# Patient Record
Sex: Female | Born: 1935 | Race: White | Hispanic: No | Smoking: Former smoker
Health system: Southern US, Academic
[De-identification: ages and names within clinical notes are randomized; demographics above are authoritative.]

## PROBLEM LIST (undated history)

## (undated) DIAGNOSIS — I2510 Atherosclerotic heart disease of native coronary artery without angina pectoris: Secondary | ICD-10-CM

## (undated) DIAGNOSIS — M51369 Other intervertebral disc degeneration, lumbar region without mention of lumbar back pain or lower extremity pain: Secondary | ICD-10-CM

## (undated) DIAGNOSIS — L821 Other seborrheic keratosis: Secondary | ICD-10-CM

## (undated) DIAGNOSIS — M48061 Spinal stenosis, lumbar region without neurogenic claudication: Secondary | ICD-10-CM

## (undated) DIAGNOSIS — M5136 Other intervertebral disc degeneration, lumbar region: Secondary | ICD-10-CM

## (undated) DIAGNOSIS — G43909 Migraine, unspecified, not intractable, without status migrainosus: Secondary | ICD-10-CM

## (undated) HISTORY — DX: Other seborrheic keratosis: L82.1

## (undated) HISTORY — PX: MOUTH SURGERY: SHX715

## (undated) HISTORY — PX: HX TONSILLECTOMY: SHX27

## (undated) HISTORY — PX: HX HYSTERECTOMY: SHX81

## (undated) HISTORY — DX: Atherosclerotic heart disease of native coronary artery without angina pectoris: I25.10

## (undated) HISTORY — DX: Spinal stenosis, lumbar region without neurogenic claudication: M48.061

## (undated) HISTORY — DX: Other intervertebral disc degeneration, lumbar region without mention of lumbar back pain or lower extremity pain: M51.369

## (undated) HISTORY — PX: CARDIAC CATHETERIZATION: SHX172

## (undated) HISTORY — DX: Migraine, unspecified, not intractable, without status migrainosus: G43.909

## (undated) HISTORY — PX: HX WISDOM TEETH EXTRACTION: SHX21

## (undated) HISTORY — DX: Other intervertebral disc degeneration, lumbar region: M51.36

---

## 1973-05-13 HISTORY — PX: HX HYSTERECTOMY: SHX81

## 2005-05-13 HISTORY — PX: CARDIAC CATHETERIZATION: SHX172

## 2020-09-29 ENCOUNTER — Ambulatory Visit (HOSPITAL_COMMUNITY): Payer: Self-pay | Admitting: Family Medicine

## 2021-11-19 ENCOUNTER — Encounter (RURAL_HEALTH_CENTER): Payer: Self-pay | Admitting: Family Medicine

## 2021-11-19 NOTE — Progress Notes (Signed)
Chronic Disease Management-AMB  INTERVAL HISTORY    88 year old female here today for follow up  no acute complaints or concerns  doing well today    CHRONIC CONDITIONS    Multiple seborrheic keratoses:  These are scattered diffusely over her entire body, most notably across her back, upper extremities and trunk.  Several of these are large and easily get caught on her clothing.  Now established with dermatology    Degenerative disc disease of lumbar spine:  MRI from [facility] 04/25/2017:  Moderate to severe narrowing of the L5-S1 disc space  Mild to moderate bilateral facet joint hypertrophy at L4-5 and L5-S1  Mild concentric foraminal stenosis at L5-S1  Referral to physical therapy May 2022  Completed PT which helped and still does home exercises    Coronary artery disease:  Had a stent placed years ago.  I was never on dual antiplatelet therapy per her report.  Previously on aspirin 325 mg daily.  She had GI side effects from this and discontinued this sometime ago.  Discussed risks and benefits, restarted aspirin 81 mg daily May 2022    PREVENTATIVE  Bone density ordered May 2022  Counseled on mammography May 2022, patient declined given her advanced age, no family history of breast cancer.  Colonoscopy was "years ago", no family history of colon cancer, declines further screening at this time.                     Assessment & Plan  (1) Coronary artery disease involving native coronary artery of native heart:    Status:Acute    Code(s):  I25.10 - Atherosclerotic heart disease of native coronary artery without angina pectoris    Qualifiers:     Associated angina:without angina Qualified Code(s):I25.10 - Atherosclerotic heart disease of native coronary artery without angina pectoris  (2) Seborrheic keratoses:    Status:Acute    Code(s):  L82.1 - Other seborrheic keratosis  (3) DDD (degenerative disc disease), lumbar:    Status:Acute    Code(s):  M51.36 - Other  intervertebral disc degeneration, lumbar region    Plan  Bone density was ordered last may but never got performed, reordered today.  Will recheck labs at six month follow up  She is doing well otherwise today  follow up sooner if needed.  Counseled on vaccines today, declined.     Orders:  Orders   XR bone densitometry Today Z13.820 - Encounter for screening for osteoporosis, Z78.0 - Asymptomatic menopausal state

## 2021-11-27 ENCOUNTER — Ambulatory Visit (RURAL_HEALTH_CENTER): Payer: Self-pay | Admitting: Family Medicine

## 2022-04-09 ENCOUNTER — Other Ambulatory Visit: Payer: Self-pay

## 2022-04-09 ENCOUNTER — Ambulatory Visit (INDEPENDENT_AMBULATORY_CARE_PROVIDER_SITE_OTHER): Payer: Self-pay | Admitting: NURSE PRACTITIONER

## 2022-04-09 ENCOUNTER — Encounter (INDEPENDENT_AMBULATORY_CARE_PROVIDER_SITE_OTHER): Payer: Self-pay | Admitting: NURSE PRACTITIONER

## 2022-04-09 ENCOUNTER — Ambulatory Visit (INDEPENDENT_AMBULATORY_CARE_PROVIDER_SITE_OTHER): Payer: Medicare Other | Admitting: NURSE PRACTITIONER

## 2022-04-09 VITALS — Ht 62.0 in | Wt 145.0 lb

## 2022-04-09 DIAGNOSIS — H6123 Impacted cerumen, bilateral: Secondary | ICD-10-CM

## 2022-04-09 NOTE — Progress Notes (Signed)
ENT, [facility]  [address]  [place] [address]  Phone: [phone]  Fax: [phone]      Encounter Date: 04/09/2022    Patient ID: Judith Walton  MRN: [id number]    DOB: December 24, 1933  Age: 88 y.o. female         Referring Provider:    No referring provider defined for this encounter.    Reason for Visit:   Chief Complaint   Patient presents with    Ear Problem(s)     Patient complains of bil cerumen impaction. Patient states she has a hx of a tm perf L ear        History of Present Illness:  Judith Walton is a 88 y.o. female referred for cerumen impaction.  She has history of chronic left TM perforation      Patient History:  There is no problem list on file for this patient.    No current outpatient medications on file.     No Known Allergies  Past Medical History:   Diagnosis Date    Coronary artery disease involving native coronary artery of native heart     DDD (degenerative disc disease), lumbar     Foraminal stenosis of lumbar region     Migraine     Seborrheic keratoses       Past Surgical History:   Procedure Laterality Date    CARDIAC CATHETERIZATION      HX HYSTERECTOMY      HX TONSILLECTOMY      MOUTH SURGERY      Dr Phylliss Bob oral surgery-sept 2022,cyst removal      Family Medical History:       Problem Relation (Age of Onset)    Cancer Other    Heart Disease Other    Hypertension (High Blood Pressure) Other    Migraines Other    No Known Problems Father, Brother, Brother, Brother            Social History     Tobacco Use    Smoking status: Never    Smokeless tobacco: Never   Vaping Use    Vaping Use: Unknown   Substance Use Topics    Alcohol use: Never    Drug use: Never       Review of Systems     Vitals:    04/09/22 1626   Weight: 65.8 kg (145 lb)   Height: 1.575 m (5\' 2" )   BMI: 26.58      ENT Physical Exam  Constitutional  Appearance: patient appears well-developed, well-nourished and well-groomed,  Communication/Voice: communication appropriate for developmental age;  vocal quality normal;  Head and Face  Appearance: head appears normal, face appears normal and face appears atraumatic;  Ear  Hearing: intact;  Auricles: right auricle normal; left auricle normal;  External Mastoids: right external mastoid normal; left external mastoid normal;  Ear Canals: bilateral ear canals impacted cerumen observed;  Tympanic Membranes: right tympanic membrane normal; left tympanic membrane normal;  Neurovestibular  Mental Status: alert and oriented;  Psychiatric: mood normal; affect is appropriate;       Assessment:  ENCOUNTER DIAGNOSES     ICD-10-CM   1. Bilateral impacted cerumen  H61.23       Plan:  Medical records reviewed on 04/09/2022.  Cerumen removed AU    Orders Placed This Encounter    [phone] - REMOVAL IMPACTED CERUMEN W/ INSTRUMENT, UNILATERAL (AMB ONLY-PD)     Return in about 1 year (around 04/10/2023).  Emiliano Dyer, FNP-BC  04/09/2022, 16:48

## 2022-04-10 ENCOUNTER — Ambulatory Visit (INDEPENDENT_AMBULATORY_CARE_PROVIDER_SITE_OTHER): Payer: Medicare Other | Admitting: NURSE PRACTITIONER

## 2022-04-17 NOTE — Procedures (Signed)
ENT, [facility]  [place] [address]    Procedure Note    Name: Judith Walton MRN:  [id number]   Date: 04/09/2022 Age: 88 y.o.  DOB:   [birth year]       [id number] - REMOVAL IMPACTED CERUMEN W/ INSTRUMENT, UNILATERAL (AMB ONLY-PD)    Performed by: Emiliano Dyer, FNP-BC  Authorized by: Emiliano Dyer, FNP-BC    Documentation:      Procedure: Cerumen cleaning   Pre-op Dx: Cerumen impaction   Bilateral EACs examined under binocular microscopy. Cerumen and/or debris was cleaned from the canals using curettes, suction, and alligator forceps. Patient tolerated procedure well.            Emiliano Dyer, FNP-BC

## 2022-10-19 ENCOUNTER — Other Ambulatory Visit: Payer: Self-pay

## 2022-10-19 ENCOUNTER — Emergency Department
Admission: EM | Admit: 2022-10-19 | Discharge: 2022-10-19 | Disposition: A | Discharge status: Home / Self Care | Payer: Medicare Other | Attending: Physician Assistant | Admitting: Physician Assistant

## 2022-10-19 ENCOUNTER — Encounter (HOSPITAL_COMMUNITY): Payer: Self-pay | Admitting: Physician Assistant

## 2022-10-19 ENCOUNTER — Emergency Department (HOSPITAL_COMMUNITY): Payer: Medicare Other

## 2022-10-19 DIAGNOSIS — S52692A Other fracture of lower end of left ulna, initial encounter for closed fracture: Secondary | ICD-10-CM | POA: Insufficient documentation

## 2022-10-19 DIAGNOSIS — W010XXA Fall on same level from slipping, tripping and stumbling without subsequent striking against object, initial encounter: Secondary | ICD-10-CM | POA: Insufficient documentation

## 2022-10-19 DIAGNOSIS — S52592A Other fractures of lower end of left radius, initial encounter for closed fracture: Secondary | ICD-10-CM | POA: Insufficient documentation

## 2022-10-19 DIAGNOSIS — S52509A Unspecified fracture of the lower end of unspecified radius, initial encounter for closed fracture: Secondary | ICD-10-CM

## 2022-10-19 DIAGNOSIS — S0081XA Abrasion of other part of head, initial encounter: Secondary | ICD-10-CM | POA: Insufficient documentation

## 2022-10-19 DIAGNOSIS — W0110XA Fall on same level from slipping, tripping and stumbling with subsequent striking against unspecified object, initial encounter: Secondary | ICD-10-CM

## 2022-10-19 MED ORDER — HYDROCODONE 5 MG-ACETAMINOPHEN 325 MG TABLET
1.0000 | ORAL_TABLET | Freq: Four times a day (QID) | ORAL | 0 refills | Status: DC | PRN
Start: 2022-10-19 — End: 2023-03-10

## 2022-10-19 MED ORDER — HYDROCODONE 5 MG-ACETAMINOPHEN 325 MG TABLET
ORAL_TABLET | ORAL | Status: AC
Start: 2022-10-19 — End: 2022-10-19
  Filled 2022-10-19: qty 1

## 2022-10-19 MED ORDER — HYDROCODONE 5 MG-ACETAMINOPHEN 325 MG TABLET
2.0000 | ORAL_TABLET | ORAL | Status: AC
Start: 2022-10-19 — End: 2022-10-19
  Administered 2022-10-19: 2 via ORAL

## 2022-10-19 NOTE — ED Triage Notes (Signed)
Fall left wrist injury approx 45 mins ago

## 2022-10-19 NOTE — Discharge Instructions (Signed)
Keep the splint intact.  A prescription for pain medication was written take this as needed.  Follow up with Dr. Nicholes Stairs (orthopedic doctor) contact information provided in this packet.  If new or worsening symptoms prior to follow-up return to the emergency department

## 2022-10-19 NOTE — ED Provider Notes (Signed)
[facility]  ED Primary Provider Note  History of Present Illness   Chief Complaint   Patient presents with    Judith Walton is a 89 y.o. female who had concerns including Fall.  Arrival: The patient arrived by Car    Patient is an 89 year old female past will history of coronary artery disease degenerative disc disease of the lumbar spine presents emergency department with complaints of fall and injury to the left wrist.  States she was chasing her dog tripped over a rock.  States she did hit her head she was not anticoagulated she did not lose consciousness denies headache or neck pain.  Denies nausea vomiting.      History Reviewed This Encounter: Medical History  Surgical History  Family History  Social History    Physical Exam   ED Triage Vitals [10/19/22 1829]   BP (Non-Invasive) (!) 169/79   Heart Rate 99   Respiratory Rate 20   Temperature 36.8 C (98.2 F)   SpO2 98 %   Weight 65.8 kg (145 lb)   Height 1.585 m (5' 2.4")     Physical Exam  Constitutional:       Appearance: Normal appearance.   HENT:      Head: Normocephalic.      Comments: Small superficial abrasion over the left forehead     Nose: Nose normal.      Mouth/Throat:      Mouth: Mucous membranes are moist.   Eyes:      Extraocular Movements: Extraocular movements intact.      Pupils: Pupils are equal, round, and reactive to light.   Cardiovascular:      Rate and Rhythm: Normal rate and regular rhythm.      Pulses: Normal pulses.      Heart sounds: Normal heart sounds.   Pulmonary:      Effort: Pulmonary effort is normal.      Breath sounds: Normal breath sounds.   Abdominal:      General: Abdomen is flat.      Palpations: Abdomen is soft.   Musculoskeletal:      Cervical back: Normal range of motion and neck supple.      Comments: Swelling of the distal radius with tenderness to palpation neurovascularly intact distally mild deformity   Neurological:      Mental Status: She is alert.       Patient  Data   Labs Ordered/Reviewed - No data to display  XR WRIST LEFT   Final Result by Edi, Radresults In (06/08 1910)   ACUTE FRACTURES OF THE DISTAL LEFT RADIUS AND ULNA                Radiologist location ID: [id number]           Medical Decision Making        Medical Decision Making  Plain films of the left wrist demonstrate mildly displaced angulated distal radius fracture with comminuted ulnar styloid fracture.  Reviewed films and discussed case with Dr. Jennette Bill recommended splinting and follow up in office.  Patient given a prescription for Lortab.  If new or worsening symptoms return to the emergency depart    Amount and/or Complexity of Data Reviewed  Radiology: ordered.                   Clinical Impression   Distal radius fracture (Primary)       Disposition: Discharged

## 2022-10-19 NOTE — ED Nurses Note (Signed)
Patient discharged at this time with all discharge instructions explained and questions answered. Patient given written prescriptions and out of ER at this time via ambulation.

## 2023-02-25 ENCOUNTER — Ambulatory Visit (INDEPENDENT_AMBULATORY_CARE_PROVIDER_SITE_OTHER): Payer: Self-pay

## 2023-03-10 ENCOUNTER — Ambulatory Visit (INDEPENDENT_AMBULATORY_CARE_PROVIDER_SITE_OTHER): Payer: Medicare Other

## 2023-03-10 ENCOUNTER — Other Ambulatory Visit: Payer: Self-pay

## 2023-03-10 ENCOUNTER — Encounter (INDEPENDENT_AMBULATORY_CARE_PROVIDER_SITE_OTHER): Payer: Self-pay

## 2023-03-10 ENCOUNTER — Other Ambulatory Visit: Payer: Medicare Other

## 2023-03-10 VITALS — BP 135/78 | HR 93 | Temp 98.4°F | Resp 16 | Ht 62.0 in | Wt 154.2 lb

## 2023-03-10 DIAGNOSIS — M25532 Pain in left wrist: Secondary | ICD-10-CM

## 2023-03-10 DIAGNOSIS — R5383 Other fatigue: Secondary | ICD-10-CM

## 2023-03-10 DIAGNOSIS — G8929 Other chronic pain: Secondary | ICD-10-CM

## 2023-03-10 DIAGNOSIS — G43909 Migraine, unspecified, not intractable, without status migrainosus: Secondary | ICD-10-CM

## 2023-03-10 DIAGNOSIS — M51369 Other intervertebral disc degeneration, lumbar region without mention of lumbar back pain or lower extremity pain: Secondary | ICD-10-CM

## 2023-03-10 DIAGNOSIS — Z136 Encounter for screening for cardiovascular disorders: Secondary | ICD-10-CM

## 2023-03-10 DIAGNOSIS — I2510 Atherosclerotic heart disease of native coronary artery without angina pectoris: Secondary | ICD-10-CM

## 2023-03-10 DIAGNOSIS — L821 Other seborrheic keratosis: Secondary | ICD-10-CM

## 2023-03-10 DIAGNOSIS — Z0000 Encounter for general adult medical examination without abnormal findings: Secondary | ICD-10-CM | POA: Insufficient documentation

## 2023-03-10 LAB — HEPATIC FUNCTION PANEL
ALBUMIN/GLOBULIN RATIO: 1.4 (ref 0.8–1.4)
ALBUMIN: 4.2 g/dL (ref 3.5–5.7)
ALKALINE PHOSPHATASE: 56 U/L (ref 34–104)
ALT (SGPT): 14 U/L (ref 7–52)
AST (SGOT): 14 U/L (ref 13–39)
BILIRUBIN DIRECT: <0.1 md/dL (ref 0.03–0.18)
BILIRUBIN TOTAL: 0.4 mg/dL (ref 0.3–1.0)
BILIRUBIN, INDIRECT: >0.3 mg/dL (ref <=1)
GLOBULIN: 3 (ref 2.9–5.4)
PROTEIN TOTAL: 7.2 g/dL (ref 6.4–8.9)

## 2023-03-10 LAB — CBC WITH DIFF
BASOPHIL #: 0.1 10*3/uL (ref 0.00–0.10)
BASOPHIL %: 1 % (ref 0–1)
EOSINOPHIL #: 0.1 10*3/uL (ref 0.00–0.50)
EOSINOPHIL %: 2 % (ref 1–7)
HCT: 40 % (ref 31.2–41.9)
HGB: 13.4 g/dL (ref 10.9–14.3)
LYMPHOCYTE #: 2.1 10*3/uL (ref 1.00–3.00)
LYMPHOCYTE %: 25 % (ref 16–44)
MCH: 29.7 pg (ref 24.7–32.8)
MCHC: 33.6 g/dL (ref 32.3–35.6)
MCV: 88.6 fL (ref 75.5–95.3)
MONOCYTE #: 0.5 10*3/uL (ref 0.30–1.00)
MONOCYTE %: 6 % (ref 5–13)
MPV: 10.5 fL (ref 7.9–10.8)
NEUTROPHIL #: 5.4 10*3/uL (ref 1.85–7.80)
NEUTROPHIL %: 66 % (ref 43–77)
PLATELETS: 344 10*3/uL (ref 140–440)
RBC: 4.51 10*6/uL (ref 3.63–4.92)
RDW: 14 % (ref 12.3–17.7)
WBC: 8.2 10*3/uL (ref 3.8–11.8)

## 2023-03-10 LAB — BASIC METABOLIC PANEL
ANION GAP: 6 mmol/L (ref 4–13)
BUN/CREA RATIO: 22 (ref 6–22)
BUN: 17 mg/dL (ref 7–25)
CALCIUM: 9.5 mg/dL (ref 8.6–10.3)
CHLORIDE: 104 mmol/L (ref 98–107)
CO2 TOTAL: 28 mmol/L (ref 21–31)
CREATININE: 0.76 mg/dL (ref 0.60–1.30)
ESTIMATED GFR: 76 mL/min/{1.73_m2} (ref >59)
GLUCOSE: 84 mg/dL (ref 74–109)
OSMOLALITY, CALCULATED: 277 mosm/kg (ref 270–290)
POTASSIUM: 4.2 mmol/L (ref 3.5–5.1)
SODIUM: 138 mmol/L (ref 136–145)

## 2023-03-10 LAB — LIPID PANEL
CHOL/HDL RATIO: 3.7
CHOLESTEROL: 258 mg/dL — ABNORMAL HIGH (ref <200)
HDL CHOL: 69 mg/dL (ref >=40)
LDL CALC: 166 mg/dL — ABNORMAL HIGH (ref 0–100)
TRIGLYCERIDES: 115 mg/dL (ref <=150)
VLDL CALC: 23 mg/dL (ref 0–50)

## 2023-03-10 LAB — THYROID STIMULATING HORMONE WITH FREE T4 REFLEX: TSH: 1.254 u[IU]/mL (ref 0.450–5.330)

## 2023-03-10 NOTE — Assessment & Plan Note (Signed)
Patient does not have imaging of this and denies any ROM limitation or back pain associated with the diagnosis.

## 2023-03-10 NOTE — Nursing Note (Signed)
03/10/23 1017   Fall Risk Assessment   Do you feel unsteady when standing or walking? No   Do you worry about falling? Yes   Have you fallen in the past year? Yes   How many times have you fallen? Once   Were you ever injured from falling? Yes

## 2023-03-10 NOTE — Progress Notes (Signed)
[facility]  FAMILY MEDICINE, MEDICAL OFFICE BUILDING  [address]  [place] [address]  [phone]   Judith Walton  1934/04/29  [id number]    Date of Service: 03/10/2023  Chief complaint:   Chief Complaint   Patient presents with    New Patient     Subjective:     Judith Walton is a 89 y.o. female and presents today as a new patient to establish care, baseline lab values and medication refills. Patient denies being established with a PCP since moving to the area from [place]. She endorses ongoing management with ENT for periodic scraping of cerumen impactions. She visits her Optometrist every 2 years for glasses. Patient was recently associated with Orthopedic surgery s/p fracture of distal left radius and ulna. She verbalizes her left wrist pain as her only acute complaint. Patient states that she has limited ROM and constant pain and weakness 4 months after the break.    Past Medical History:   Diagnosis Date    Coronary artery disease involving native coronary artery of native heart     S/P MI and stent placement    DDD (degenerative disc disease), lumbar     Foraminal stenosis of lumbar region     Migraine     Seborrheic keratoses      Past Surgical History:   Procedure Laterality Date    CARDIAC CATHETERIZATION  2007    Stent placement    HX HYSTERECTOMY  1975    HX TONSILLECTOMY      HX WISDOM TEETH EXTRACTION      MOUTH SURGERY      Dr Phylliss Bob oral surgery-sept 2022,cyst removal     No current outpatient medications on file.     No current facility-administered medications for this visit.     No Known Allergies    Review of Systems  Any pertinent Review of Systems as addressed in the HPI above.    Objective:     BP 135/78   Pulse 93   Temp 36.9 C (98.4 F) (Temporal)   Resp 16   Ht 1.575 m (5\' 2" )   Wt 69.9 kg (154 lb 3.2 oz)   SpO2 96%   BMI 28.20 kg/m      Physical Exam  Vitals reviewed.   Constitutional:       General: She is not in acute  distress.     Appearance: Normal appearance.   Cardiovascular:      Rate and Rhythm: Normal rate and regular rhythm.      Pulses: Normal pulses.      Heart sounds: Normal heart sounds.   Pulmonary:      Effort: Pulmonary effort is normal.      Breath sounds: Normal breath sounds.   Abdominal:      General: Bowel sounds are normal.      Palpations: Abdomen is soft.   Musculoskeletal:      Left wrist: Bony tenderness present. Decreased range of motion.   Skin:     General: Skin is warm and dry.      Capillary Refill: Capillary refill takes less than 2 seconds.   Neurological:      General: No focal deficit present.      Mental Status: She is alert and oriented to person, place, and time. Mental status is at baseline.   Psychiatric:         Attention and Perception: Attention normal.  Mood and Affect: Mood and affect normal.         Speech: Speech normal.         Behavior: Behavior normal. Behavior is cooperative.         Thought Content: Thought content normal.       Laboratory:  No recent lab results available to review with the patient.  Assessment & Plan  DDD (degenerative disc disease), lumbar  Patient does not have imaging of this and denies any ROM limitation or back pain associated with the diagnosis.  Migraine  She states that she had constant migraines in her 40's and 50's but denies having one in many years.   Coronary artery disease involving native coronary artery of native heart  Patient verbalizes this was revealed s/p MI and stent placement in 2007, but denies any symptoms or treatment.  Seborrheic keratoses  Patient endorses periodic dermatology appointments whenever she wants to have some of these removed.   Fatigue, unspecified type  Lab work ordered to determine cause.  Chronic wrist pain, left  OTC NSAID'S, ineffective. Left wrist Xray ordered.    I reviewed the patient's past medical, surgical, social and family histories. Immunizations and allergies were reviewed and discussed. Counseling  with regards to preventative care given. Medication summary was discussed with the patient to verify compliance and understanding. Medication safety was discussed. A good faith effort was made to reconcile the patient's medications. Lab orders were placed for BMP, CBC, Hepatic function panel, Lipid panel and TSH to establish a baseline in a new patient. The patient was given the opportunity to ask questions and those questions were answered to the patient's satisfaction. The patient was encouraged to call with any additional questions or concerns. Instructed patient to call back with onset of new symptoms or if current symptoms persist or worsen. More than 50% of the visit was spent counseling and coordinating care.     Plan:  Orders Placed This Encounter    BASIC METABOLIC PANEL    CBC/DIFF    HEPATIC FUNCTION PANEL    LIPID PANEL    THYROID STIMULATING HORMONE WITH FREE T4 REFLEX     Depression screening is negative. PHQ 2 Total: 0     Return in about 6 months (around 09/08/2023).  Ignacia Marvel, APRN,NP-C 03/10/2023, 10:46

## 2023-03-10 NOTE — Nursing Note (Signed)
03/10/23 1016   Recent Weight Change   Have you had a recent unexplained weight loss or gain? N   Health Education and Literacy   How often do you have a problem understanding what is told to you about your medical condition?  Never   Domestic Violence   Because we are aware of abuse and domestic violence today, we ask all patients: Are you being hurt, hit, or frightened by anyone at your home or in your life?  N   Basic Needs   Do you have any basic needs within your home that are not being met? (such as Food, Shelter, [employer] fast streamer, Tranportation, paying for bills and/or medications) N   Advanced Directives   Do you have any advanced directives? No Advance   Would you like an advanced directive packet? Refused Packet

## 2023-03-10 NOTE — Assessment & Plan Note (Signed)
Patient verbalizes this was revealed s/p MI and stent placement in 2007, but denies any symptoms or treatment.

## 2023-03-10 NOTE — Nursing Note (Signed)
03/10/23 1018   Depression Screen   Little interest or pleasure in doing things. 0   Feeling down, depressed, or hopeless 0   PHQ 2 Total 0

## 2023-03-10 NOTE — Assessment & Plan Note (Signed)
She states that she had constant migraines in her 40's and 65's but denies having one in many years.

## 2023-03-10 NOTE — Assessment & Plan Note (Signed)
Patient endorses periodic dermatology appointments whenever she wants to have some of these removed.

## 2023-03-21 ENCOUNTER — Ambulatory Visit
Admission: RE | Admit: 2023-03-21 | Discharge: 2023-03-21 | Disposition: A | Discharge status: Home / Self Care | Payer: Medicare Other | Source: Ambulatory Visit

## 2023-03-21 ENCOUNTER — Other Ambulatory Visit: Payer: Self-pay

## 2023-03-21 DIAGNOSIS — M25532 Pain in left wrist: Secondary | ICD-10-CM | POA: Insufficient documentation

## 2023-03-21 DIAGNOSIS — M19032 Primary osteoarthritis, left wrist: Secondary | ICD-10-CM

## 2023-03-21 DIAGNOSIS — G8929 Other chronic pain: Secondary | ICD-10-CM | POA: Insufficient documentation

## 2023-03-24 ENCOUNTER — Other Ambulatory Visit (INDEPENDENT_AMBULATORY_CARE_PROVIDER_SITE_OTHER): Payer: Self-pay

## 2023-03-24 DIAGNOSIS — G8929 Other chronic pain: Secondary | ICD-10-CM

## 2023-03-28 IMAGING — MR MRI WRIST LT W/O CONTRAST
4 of 6 series · 25 of 40 positions shown · non-contrast
Comparison: None available.

﻿EXAM:  13332   MRI WRIST LT W/O CONTRAST
INDICATION: Fall, wrist pain.
TECHNIQUE: Noncontrast multiplanar, multisequence MRI was performed.

[Series 8: T1 · coronal · left · 3.0mm · 0.20mm/px · 6 of 14 slices shown (1 of 2)]
[im 1/14]
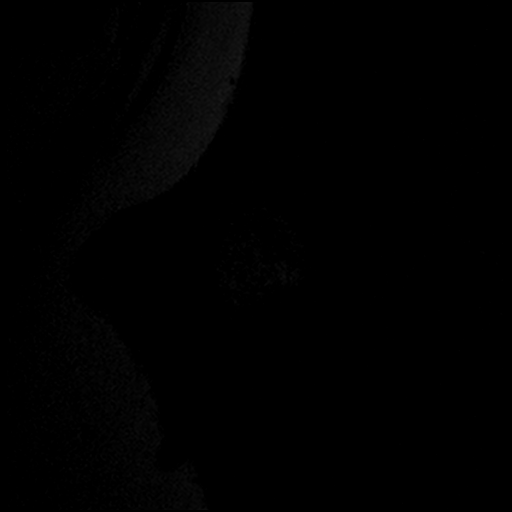
[im 3/14]
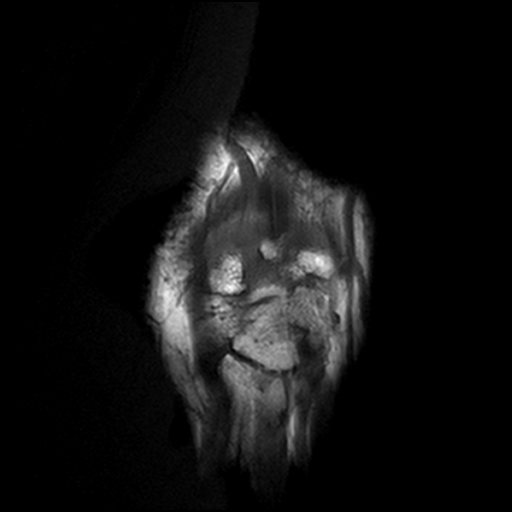
[im 6/14]
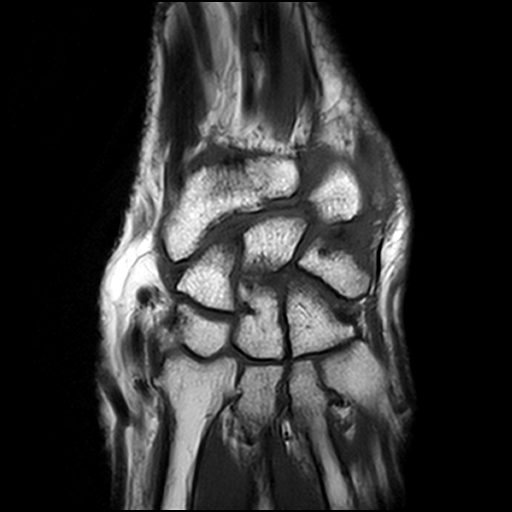
[im 8/14]
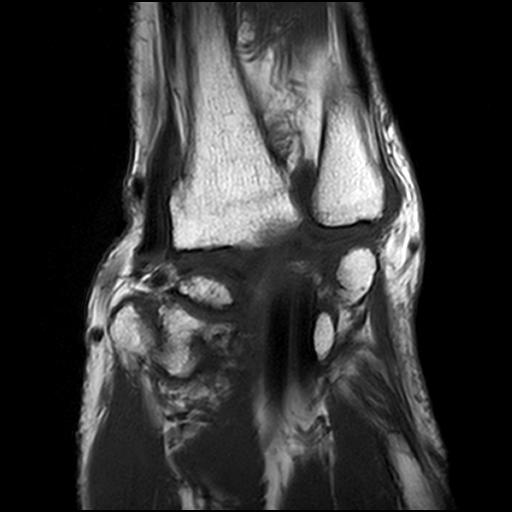
[im 11/14]
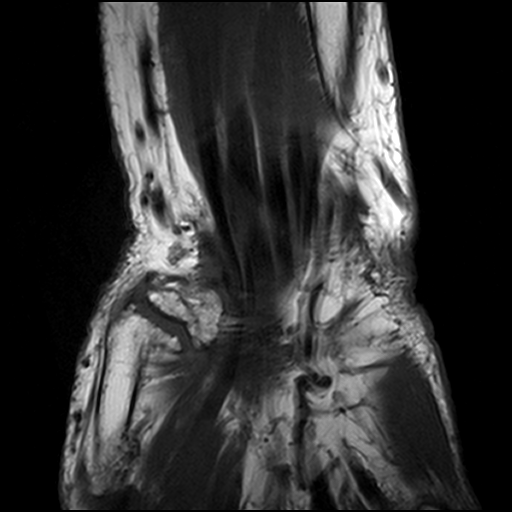
[im 14/14]
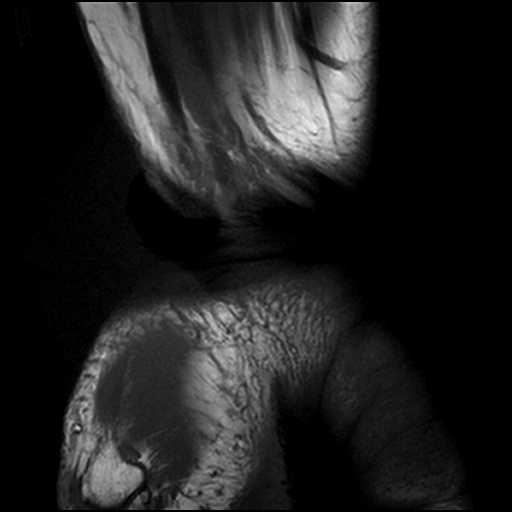

[Series 10: T1 · sagittal · left · 3.0mm · 0.22mm/px · 5 of 20 slices shown (2 of 2)]
[im 1/20]
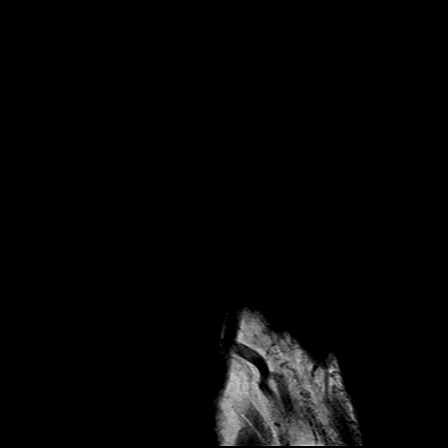
[im 3/20]
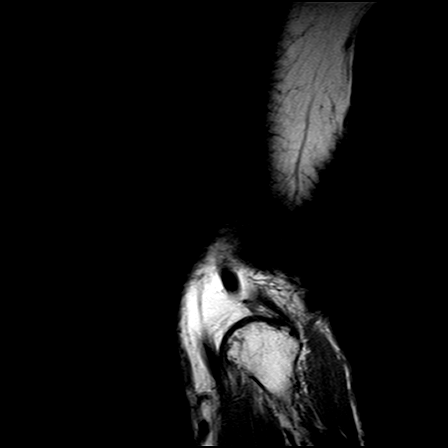
[im 6/20]
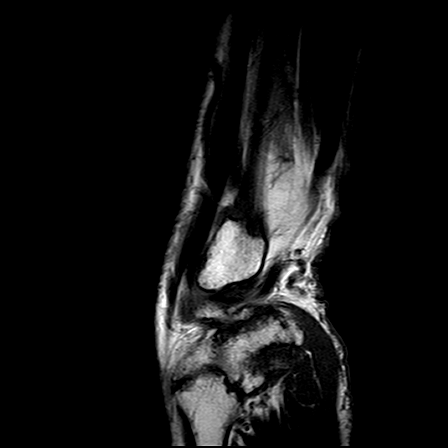
[im 11/20]
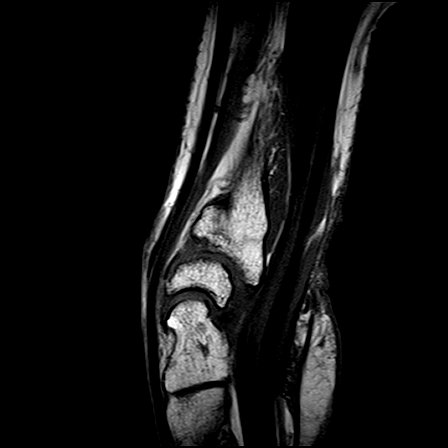
[im 17/20]
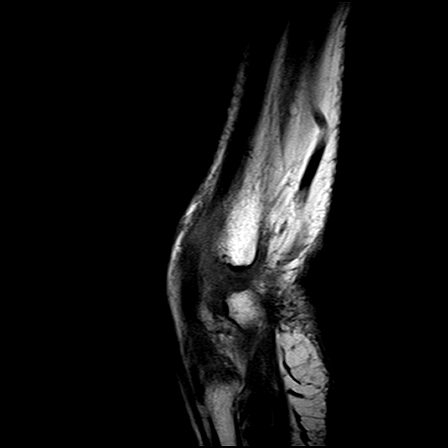

[Series 14: PD fat-sat · axial · left · 3.5mm · 0.22mm/px · z∈[-41,+35]mm · 7 of 20 slices shown]
[im 1/20]
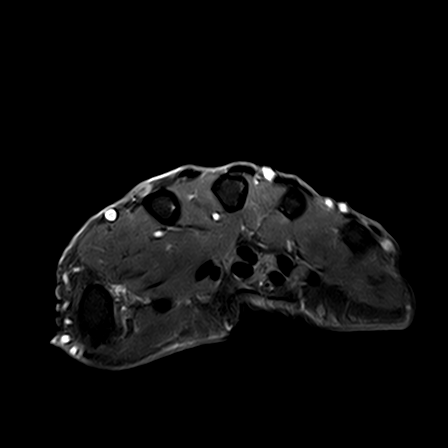
[im 4/20]
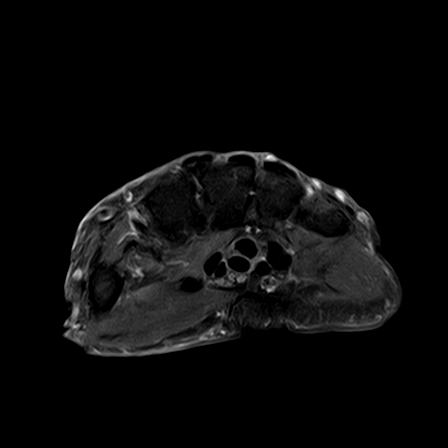
[im 7/20]
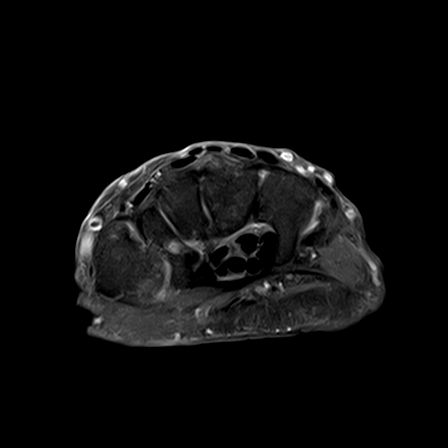
[im 10/20]
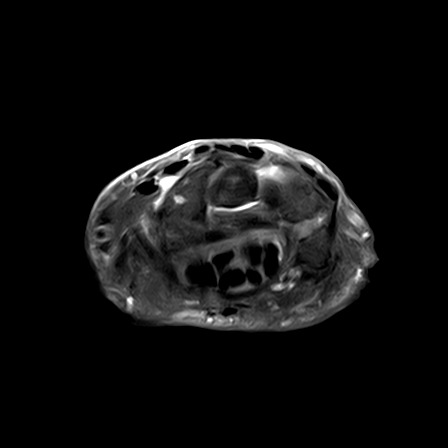
[im 13/20]
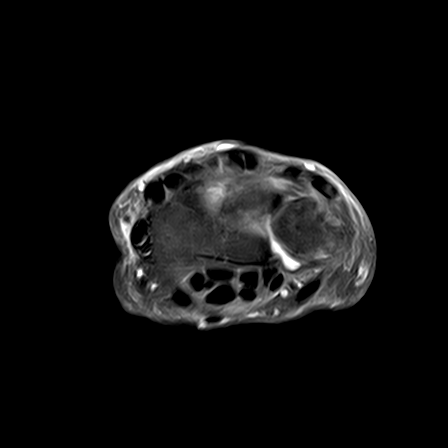
[im 16/20]
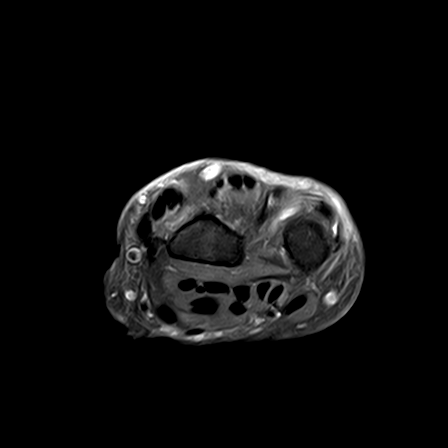
[im 20/20]
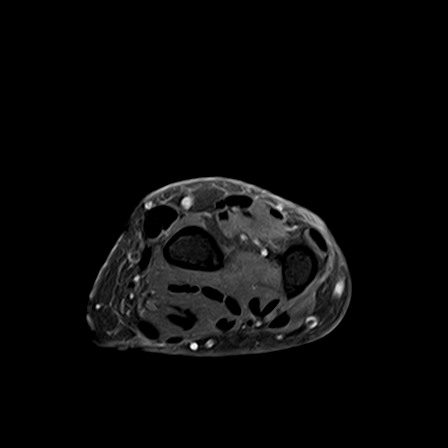

[Series 18: PD · sagittal · left · 3.0mm · 0.22mm/px · 7 of 20 slices shown]
[im 1/20]
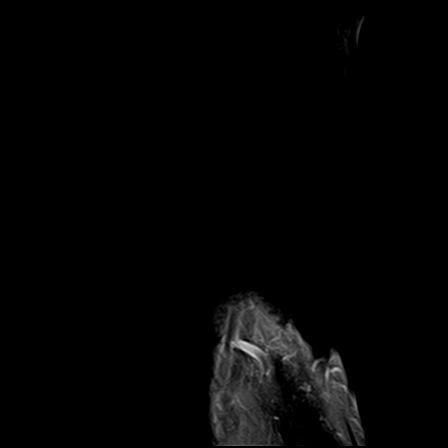
[im 4/20]
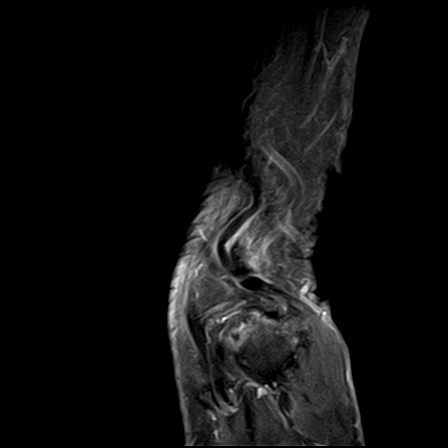
[im 7/20]
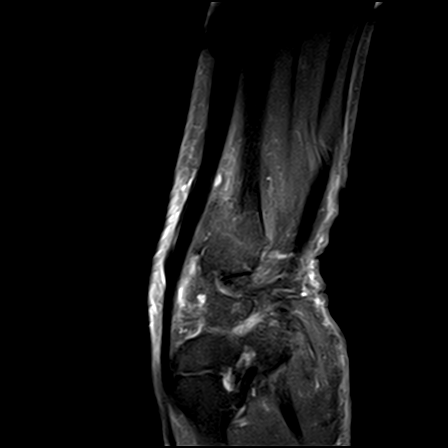
[im 10/20]
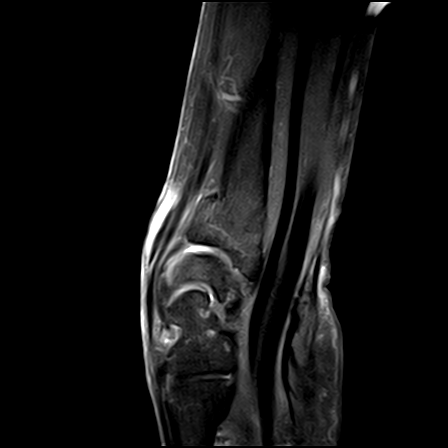
[im 13/20]
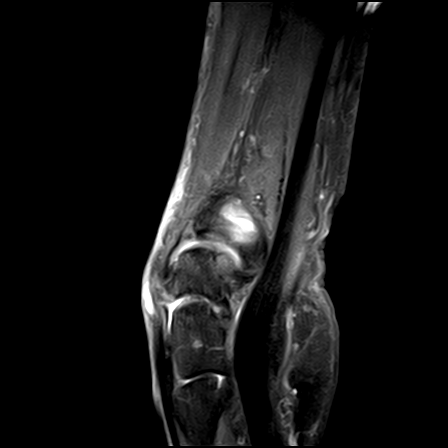
[im 16/20]
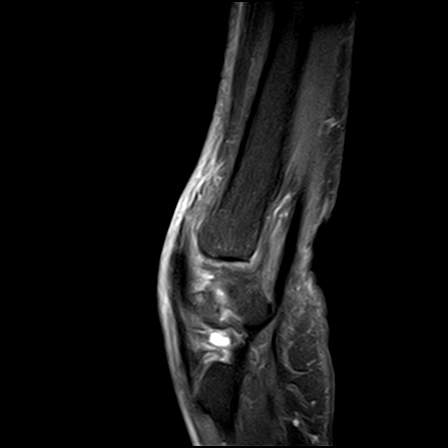
[im 20/20]
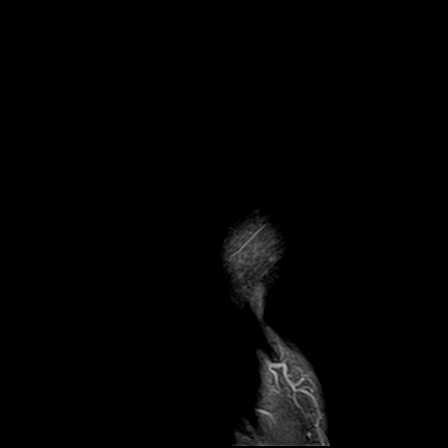

[25 of 40 positions shown; findings below may reference images not displayed]

FINDINGS: There are healing fractures of the distal radius and ulna.  The radial fracture is impacted and dorsally angulated.  There is evidence of mild dorsal instability in the mid carpus.  

There is a small joint effusion.  There are moderately severe degenerative changes.  No acute fracture is seen.
IMPRESSION: Healing fractures of the distal radius and ulna with mild to moderate mid carpal dorsal instability.

## 2023-04-14 ENCOUNTER — Ambulatory Visit: Payer: Medicare Other | Attending: NURSE PRACTITIONER | Admitting: NURSE PRACTITIONER

## 2023-04-14 ENCOUNTER — Encounter (INDEPENDENT_AMBULATORY_CARE_PROVIDER_SITE_OTHER): Payer: Self-pay | Admitting: NURSE PRACTITIONER

## 2023-04-14 ENCOUNTER — Other Ambulatory Visit: Payer: Self-pay

## 2023-04-14 VITALS — Ht 62.0 in | Wt 153.0 lb

## 2023-04-14 DIAGNOSIS — H6123 Impacted cerumen, bilateral: Secondary | ICD-10-CM | POA: Insufficient documentation

## 2023-04-14 NOTE — Progress Notes (Signed)
ENT, [facility]  [address]  [place] [address]  Phone: [phone]  Fax: [phone]      Encounter Date: 04/14/2023    Patient ID: Judith Walton  MRN: [id number]    DOB: December 08, 1933  Age: 89 y.o. female     Progress Note       Referring Provider:  Ignacia Marvel, Clarisse Gouge*    Reason for Visit:   Chief Complaint   Patient presents with    Wax in Ear     1 year rc on ears. Pt here for routine cerumen removal. Pt complains of occ otalgia         History of Present Illness:  Judith Walton is a 89 y.o. female cerumen check      Patient History:  Patient Active Problem List   Diagnosis    DDD (degenerative disc disease), lumbar    Migraine    Coronary artery disease involving native coronary artery of native heart    Seborrheic keratoses     No current outpatient medications on file.      No Known Allergies  Past Medical History:   Diagnosis Date    Coronary artery disease involving native coronary artery of native heart     S/P MI and stent placement    DDD (degenerative disc disease), lumbar     Foraminal stenosis of lumbar region     Migraine     Seborrheic keratoses      Past Surgical History:   Procedure Laterality Date    CARDIAC CATHETERIZATION  2007    Stent placement    HX HYSTERECTOMY  1975    HX TONSILLECTOMY      HX WISDOM TEETH EXTRACTION      MOUTH SURGERY      Dr Phylliss Bob oral surgery-sept 2022,cyst removal     Family Medical History:       Problem Relation (Age of Onset)    Cancer Other    Heart Disease Other    Hypertension (High Blood Pressure) Other    Migraines Other    No Known Problems Father, Brother, Brother, Brother    Osteoporosis Mother            Social History     Tobacco Use    Smoking status: Never    Smokeless tobacco: Never   Vaping Use    Vaping status: Never Used   Substance Use Topics    Alcohol use: Never    Drug use: Never       Review of Systems     Vitals:    04/14/23 1341   Weight: 69.4 kg (153 lb)   Height: 1.575 m (5\' 2" )   BMI: 27.98      ENT  Physical Exam  Constitutional  Appearance: patient appears well-developed, well-nourished and well-groomed,  Communication/Voice: communication appropriate for developmental age; vocal quality normal;  Head and Face  Appearance: head appears normal, face appears normal and face appears atraumatic;  Ear  Hearing: intact;  Auricles: right auricle normal; left auricle normal;  External Mastoids: right external mastoid normal; left external mastoid normal;  Ear Canals: bilateral ear canals impacted cerumen observed;  Tympanic Membranes: right tympanic membrane normal; left tympanic membrane normal;  Neurovestibular  Mental Status: alert and oriented;  Psychiatric: mood normal; affect is appropriate;       Assessment:  ENCOUNTER DIAGNOSES     ICD-10-CM   1. Bilateral impacted cerumen  H61.23  Plan:  Medical records reviewed on 04/14/2023.  Cerumen removed AU    Orders Placed This Encounter    [phone] - REMOVAL IMPACTED CERUMEN W/ INSTRUMENT, UNILATERAL (AMB ONLY-PD)     Return in about 1 year (around 04/13/2024).    Elnora Morrison, FNP-BC  04/14/2023, 13:46

## 2023-04-22 ENCOUNTER — Ambulatory Visit (HOSPITAL_COMMUNITY): Payer: Self-pay

## 2023-08-26 NOTE — Nursing Note (Signed)
 The influenza vaccine was not given because the patient/caregiver declined.

## 2023-09-08 ENCOUNTER — Ambulatory Visit: Payer: Self-pay

## 2023-09-08 ENCOUNTER — Encounter (INDEPENDENT_AMBULATORY_CARE_PROVIDER_SITE_OTHER): Payer: Self-pay

## 2023-09-08 ENCOUNTER — Ambulatory Visit (HOSPITAL_BASED_OUTPATIENT_CLINIC_OR_DEPARTMENT_OTHER): Payer: Self-pay

## 2023-09-08 ENCOUNTER — Other Ambulatory Visit: Payer: Self-pay

## 2023-09-08 VITALS — BP 137/72 | HR 86 | Temp 98.2°F | Ht 61.0 in | Wt 152.0 lb

## 2023-09-08 DIAGNOSIS — Z780 Asymptomatic menopausal state: Secondary | ICD-10-CM

## 2023-09-08 DIAGNOSIS — Z9189 Other specified personal risk factors, not elsewhere classified: Secondary | ICD-10-CM | POA: Insufficient documentation

## 2023-09-08 DIAGNOSIS — G250 Essential tremor: Secondary | ICD-10-CM | POA: Insufficient documentation

## 2023-09-08 DIAGNOSIS — Z7189 Other specified counseling: Secondary | ICD-10-CM

## 2023-09-08 DIAGNOSIS — I2510 Atherosclerotic heart disease of native coronary artery without angina pectoris: Secondary | ICD-10-CM | POA: Insufficient documentation

## 2023-09-08 DIAGNOSIS — S62102P Fracture of unspecified carpal bone, left wrist, subsequent encounter for fracture with malunion: Secondary | ICD-10-CM | POA: Insufficient documentation

## 2023-09-08 DIAGNOSIS — Z0000 Encounter for general adult medical examination without abnormal findings: Secondary | ICD-10-CM

## 2023-09-08 MED ORDER — PROPRANOLOL ER 60 MG CAPSULE,24 HR,EXTENDED RELEASE
60.0000 mg | ORAL_CAPSULE | Freq: Every day | ORAL | 1 refills | Status: DC
Start: 2023-09-08 — End: 2024-03-05

## 2023-09-08 MED ORDER — IBUPROFEN 600 MG TABLET
600.0000 mg | ORAL_TABLET | Freq: Four times a day (QID) | ORAL | 0 refills | Status: DC | PRN
Start: 2023-09-08 — End: 2023-10-27

## 2023-09-08 NOTE — Assessment & Plan Note (Signed)
 Patient advised on the available treatment options. Ibuprofen  prescription placed.

## 2023-09-08 NOTE — Progress Notes (Signed)
 PRN MEDICAL OFFICE [facility]  FAMILY MEDICINE, MEDICAL OFFICE BUILDING  [address]  [place] [address]  [phone]   Judith  ARMONIE Walton  14-Sep-1933  [id number]    Date of Service: 09/08/2023  Chief complaint:   Chief Complaint   Patient presents with    Follow Up     Subjective:   Judith Walton is a 89 y.o. female and presents today for follow-up on chronic disease management. Patient endorses compliance with medication summary and denies any acute medical complaints.    Past Medical History:   Diagnosis Date    Coronary artery disease involving native coronary artery of native heart     S/P MI and stent placement    DDD (degenerative disc disease), lumbar     Foraminal stenosis of lumbar region     Migraine     Seborrheic keratoses      Past Surgical History:   Procedure Laterality Date    CARDIAC CATHETERIZATION  2007    Stent placement    HX HYSTERECTOMY  1975    HX TONSILLECTOMY      HX WISDOM TEETH EXTRACTION      MOUTH SURGERY      Dr Seleta Dakins oral surgery-sept 2022,cyst removal     Current Outpatient Medications   Medication Sig Dispense Refill    ascorbic acid (VITAMIN C) 1,000 mg Oral Tablet Take 1 Tablet (1,000 mg total) by mouth Daily      cholecalciferol, vitamin D3, 250 mcg (10,000 unit) Oral Capsule Take 1 Capsule (10,000 Units total) by mouth Daily      Ibuprofen  (MOTRIN ) 600 mg Oral Tablet Take 1 Tablet (600 mg total) by mouth Four times a day as needed for Pain 200 Tablet 0    propranoloL  (INDERAL  LA) 60 mg Oral Capsule,Sustained Action 24 hr Take 1 Capsule (60 mg total) by mouth Daily 90 Capsule 1    vitamin B complex Oral Tablet Take 1 Tablet by mouth Daily      zinc Sulfate (ZINCATE) 50 mg zinc (220 mg) Oral Tablet Take 1 Tablet (50 mg total) by mouth Daily       No current facility-administered medications for this visit.     No Known Allergies    Review of Systems:  Any pertinent Review of Systems as addressed in the HPI above.    Objective:   BP 137/72   Pulse  86   Temp 36.8 C (98.2 F) (Temporal)   Ht 1.549 m (5\' 1" )   Wt 68.9 kg (152 lb)   SpO2 98%   BMI 28.72 kg/m      Physical Exam  Vitals reviewed.   Constitutional:       General: She is not in acute distress.     Appearance: Normal appearance.   HENT:      Nose: Nose normal.      Mouth/Throat:      Mouth: Mucous membranes are moist.   Eyes:      Pupils: Pupils are equal, round, and reactive to light.   Cardiovascular:      Rate and Rhythm: Normal rate and regular rhythm.      Pulses: Normal pulses.      Heart sounds: Normal heart sounds.   Pulmonary:      Effort: Pulmonary effort is normal.      Breath sounds: Normal breath sounds.   Abdominal:      General: Bowel sounds are normal.      Palpations: Abdomen  is soft.   Musculoskeletal:         General: Normal range of motion.      Cervical back: Normal range of motion.   Skin:     General: Skin is warm and dry.      Capillary Refill: Capillary refill takes less than 2 seconds.   Neurological:      General: No focal deficit present.      Mental Status: She is alert and oriented to person, place, and time. Mental status is at baseline.   Psychiatric:         Attention and Perception: Attention normal.         Mood and Affect: Mood and affect normal.         Speech: Speech normal.         Behavior: Behavior normal. Behavior is cooperative.         Thought Content: Thought content normal.       Laboratory:  COMPLETE BLOOD COUNT   Lab Results   Component Value Date    WBC 8.2 03/10/2023    HGB 13.4 03/10/2023    HCT 40.0 03/10/2023    PLTCNT 344 03/10/2023     DIFFERENTIAL  Lab Results   Component Value Date    PMNS 66 03/10/2023    LYMPHOCYTES 25 03/10/2023    MONOCYTES 6 03/10/2023    EOSINOPHIL 2 03/10/2023    BASOPHILS 1 03/10/2023    BASOPHILS 0.10 03/10/2023    PMNABS 5.40 03/10/2023    LYMPHSABS 2.10 03/10/2023    EOSABS 0.10 03/10/2023    MONOSABS 0.50 03/10/2023     BASIC METABOLIC PANEL  Lab Results   Component Value Date    SODIUM 138 03/10/2023     POTASSIUM 4.2 03/10/2023    CHLORIDE 104 03/10/2023    CO2 28 03/10/2023    ANIONGAP 6 03/10/2023    BUN 17 03/10/2023    CREATININE 0.76 03/10/2023    BUNCRRATIO 22 03/10/2023    GFR 76 03/10/2023    CALCIUM 9.5 03/10/2023    GLUCOSENF 84 03/10/2023      LIPID PROFILE  Lab Results   Component Value Date    CHOLESTEROL 258 (H) 03/10/2023    HDLCHOL 69 03/10/2023    LDLCHOL 166 (H) 03/10/2023    TRIG 115 03/10/2023     LIVER TESTS  Lab Results   Component Value Date    ALBUMIN 4.2 03/10/2023    AST 14 03/10/2023    ALT 14 03/10/2023    ALKPHOS 56 03/10/2023    TOTBILIRUBIN 0.4 03/10/2023    BILIRUBINCON <0.10 03/10/2023     THYROID  STIMULATING HORMONE  Lab Results   Component Value Date    TSH 1.254 03/10/2023   Most recent lab results reviewed with patient.  Assessment & Plan  Essential tremor  Patient advised on the available treatment options. Propranolol  60 mg extended release ordered.  Left wrist fracture, with malunion, subsequent encounter  Patient advised on the available treatment options. Ibuprofen  prescription placed.  Coronary artery disease involving native coronary artery of native heart  Patient verbalizes this was revealed s/p MI and stent placement in 2007, but denies any symptoms or treatment.     Counseling with regards to preventative care given. Medication summary was discussed with the patient to verify compliance and understanding. Medication safety was discussed. A good faith effort was made to reconcile the patient's medications. Lab orders were placed for BMP, CBC, Hepatic function panel, Lipid panel  and TSH. The patient was given the opportunity to ask questions and those questions were answered to the patient's satisfaction. The patient was encouraged to call with any additional questions or concerns. More than 50% of the visit was spent counseling and coordinating care.    Orders Placed This Encounter    BASIC METABOLIC PANEL    CBC/DIFF    HEPATIC FUNCTION PANEL    LIPID PANEL     Ibuprofen  (MOTRIN ) 600 mg Oral Tablet    propranoloL  (INDERAL  LA) 60 mg Oral Capsule,Sustained Action 24 hr     Return in about 6 months (around 03/09/2024).  Willadean Hark, APRN,NP-C 09/08/2023, 11:38

## 2023-09-08 NOTE — Patient Instructions (Signed)
 Medicare Preventive Services  Medicare coverage information Recommendation for YOU   Heart Disease and Diabetes   Lipid profile Every 5 years or more often if at risk for cardiovascular disease     Lab Results   Component Value Date    CHOLESTEROL 258 (H) 03/10/2023    HDLCHOL 69 03/10/2023    LDLCHOL 166 (H) 03/10/2023    TRIG 115 03/10/2023           Diabetes Screening    Yearly for those at risk for diabetes, 2 tests per year for those with prediabetes Last Glucose: 84    Diabetes Self Management Training or Medical Nutrition Therapy  For those with diabetes, up to 10 hrs initial training within a year, subsequent years up to 2 hrs of follow up training Optional for those with diabetes     Medical Nutrition Therapy  Three hours of one-on-one counseling in first year, two hours in subsequent years Optional for those with diabetes, kidney disease   Intensive Behavioral Therapy for Obesity  Face-to-face counseling, first month every week, month 2-6 every other week, month 7-12 every month if continued progress is documented Optional for those with Body Mass Index 30 or higher  Your Body mass index is 28.72 kg/m.   Tobacco Cessation (Quitting) Counseling   Covers up to 8 smoking and tobacco-use cessation counseling sessions in a 43-month period.    Optional for those that use tobacco   Cancer Screening Last Completion Date   Colorectal screening   For anyone age 21 to 88 or any age if high risk:  Screening Colonoscopy every 10 yrs if low risk,  more frequent if higher risk  OR  Cologuard Stool DNA test once every 3 years OR  Fecal Occult Blood Testing yearly OR  Flexible  Sigmoidoscopy  every 5 yr OR  CT Colonography every 5 yrs      See below for due date if applicable.   Screening Pap Test   Recommended every 3 years for all women age 30 to 42, or every five years if combined with HPV test (routine screening not needed after total hysterectomy).  Medicare covers every 2 years or yearly if high risk.  Screening  Pelvic Exam   Medicare covers every 2 years, yearly if high risk or childbearing age with abnormal Pap in last 3 yrs.     See below for due date if applicable.   Screening Mammogram   Recommended every 2 years for women age 46 to 80, or more frequent if you have a higher risk. Selectively recommended for women between 40-49 based on shared decisions about risk. Covered by Medicare up to every year for women age 24 or older   See below for due date if applicable.         Lung Cancer Screening  Annual low dose computed tomography (LDCT scan) is recommended for those age 88-88 who smoked 20 pack-years and are current smokers or quit smoking within past 15 years, after counseling by your doctor or nurse clinician about the possible benefits or harms.     See below for due date if applicable.   Vaccinations   Respiratory syncytial virus (RSV)  Age 88 years or older: Based on shared clinical decision-making with your provider.  Pneumococcal Vaccine  Recommended routinely age 88+ with one or two separate vaccines based on your risk. Recommended before age 37 if medical conditions with increased risk  Seasonal Influenza Vaccine  Once every flu season  Hepatitis B Vaccine  3 doses if risk (including anyone with diabetes or liver disease)  Shingles Vaccine  Two doses at age 88 or older  Diphtheria Tetanus Pertussis Vaccine  ONCE as adult, booster every 10 years     Immunization History   Administered Date(s) Administered   . Tetanus Toxoid/Diphtheria Toxoid/Acellular Pertussis Vaccine, Adsorbed 08/16/2017     Shingles vaccine and Diphtheria Tetanus Pertussis vaccines are available at pharmacies or local health department without a prescription.   Other Preventative Screening  Last Completion Date   Bone Densitometry   Screening: All females ages 14 and older every 10 years if initial screening normal. Postmenopausal women ages 28-64 need screening with one or more risk factor: previous fracture, parental hip fracture,  current smoker, low body weight, excessive alcohol use, Rheumatoid Arthritis   For women with diagnosed Osteoporosis, follow up is recommended every 2 years or a frequency recommended by your provider.     --07/04/2021  See below for due date if applicable.     Glaucoma Screening   Yearly if in high risk group such as diabetes, family history, African American age 85+ or Hispanic American age 51+   See your eye care provider for screening.   Hepatitis C Screening   Recommended  for those born between ages 18-79 years.     See below for due date if applicable.     HIV Testing  Recommended routinely at least ONCE, covered every year for age 22 to 56 regardless of risk, and every year for age over 80 who ask for the test or higher risk. Yearly or up to 3 times in pregnancy         See below for due date if applicable.   Abdominal Aortic Aneurysm Screening Ultrasound   Once with a family history of abdominal aortic aneurysms OR a female between65-75 and have smoked at least 100 cigarettes in your lifetime.         See below for due date if applicable.       Your Personalized Schedule for Preventive Tests   Health Maintenance: Pending and Last Completed       Date Due Completion Date    Shingles Vaccine (1 of 2) 03/09/2024 (Originally 02/01/1986) ---    Pneumococcal Vaccination, Age 88+ (1 of 1 - PCV) 03/09/2024 (Originally 02/01/1986) ---    Influenza Vaccine (Season Ended) 01/12/2024 ---    Depression Screening 09/07/2024 09/08/2023    Medicare Annual Wellness Visit 09/07/2024 09/08/2023    Adult Tdap-Td (2 - Td or Tdap) 08/17/2027 08/16/2017    Osteoporosis screening 07/05/2031 07/04/2021                For Information on Advanced Directives for Health Care:  [place]:  [email or web address]  [place], [place], [place], [place] General Information: [email or web address]

## 2023-09-08 NOTE — Nursing Note (Signed)
 09/08/23 1025   Domestic Violence   Because we are aware of abuse and domestic violence today, we ask all patients: Are you being hurt, hit, or frightened by anyone at your home or in your life?  N   Basic Needs   Do you have any basic needs within your home that are not being met? (such as Food, Shelter, [employer] fast streamer, Tranportation, paying for bills and/or medications) N

## 2023-09-08 NOTE — Assessment & Plan Note (Signed)
 Patient advised on the available treatment options. Propranolol  60 mg extended release ordered.

## 2023-09-08 NOTE — Nursing Note (Signed)
 09/08/23 1025   Fall Risk Assessment   Do you feel unsteady when standing or walking? Yes   Do you worry about falling? Yes   Have you fallen in the past year? Yes   How many times have you fallen? Once   Were you ever injured from falling? Yes

## 2023-09-08 NOTE — Assessment & Plan Note (Signed)
 Patient verbalizes this was revealed s/p MI and stent placement in 2007, but denies any symptoms or treatment.

## 2023-09-08 NOTE — Nursing Note (Signed)
 09/08/23 1026   Comprehensive Health Assessment-Adult   Do you wish to complete this form? Yes   During the past 4 weeks, how would you rate your health in general? Very Good   During the past 4 weeks, how much difficulty have you had doing your usual activities inside and outside your home because of medical or emotional problems? No difficulty at all   During the past 4 weeks, was someone available to help you if you needed and wanted help? Yes, as much as I wanted   In the past year, how many times have you gone to the emergency department or been admitted to a hospital for a health problem? None   Are you generally satisfied with your sleep? Yes   Do you have enough money to buy things you need in everyday life, such as food, clothing, medicines, and housing? Yes, always   Can you get to places beyond walking distance without help?  (For example, can you drive your own car or travel alone on buses)? Yes   Do you fasten your seatbelt when you are in a car? Yes, usually   Do you exercise 20 minutes 3 or more days per week (such as walking, dancing, biking, mowing grass, swimming)? Yes, most of the time   How often do you eat food that is healthy (fruits, vegetables, lean meats) instead of unhealthy (sweets, fast food, junk food, fatty foods)? Some of the time   Have your parents, brothers or sisters had any of the following problems before the age of 3? (check all that apply) Heart problems, or hardening of the arteries;Cancer   How often do you have trouble taking medicines the eay you are told to take them? I do not take any medicines   Do you need any help communicating with your doctors and nurses because of vision or hearing problems? No   During the past 12 months, have you experienced confusion or memory loss that is happening more often or is getting worse? No   Do you have one person you think of as your personal doctor (primary care provider or family doctor)? Yes   If you are seeing a Primary Care  Provider (PCP) or family doctor. please list their name Willadean Hark   Are you now also seeing any specialist physician(s) (such as eye doctor, foot doctor, skin doctor)? No   How confident are you that you can control or manage most of your health problems? Somewhat confident

## 2023-09-08 NOTE — Nursing Note (Signed)
 09/08/23 1028   Medicare Wellness Assessment   Medicare initial or wellness physical in the last year? Yes   Advance Directives   Does patient have a living will or MPOA No   Advance directive information given to the patient today? Yes   Activities of Daily Living   Do you need help with dressing, bathing, or walking? No   Do you need help with shopping, housekeeping, medications, or finances? No   Do you have rugs in hallways, broken steps, or poor lighting? No   Do you have grab bars in your bathroom, non-slip strips in your tub, and hand rails on your stairs? Yes   Cognitive Function Screen   What is you age? 1   What is the time to the nearest hour? 1   Remember this address: [address]   What is the year? 1   What is the name of this clinic? 1   Can the patient recognize two persons (the doctor, the nurse, home help, etc.)? 1   What is the date of your birth? (day and month sufficient)  1   In what year did World War II end? 1   Who is the current president of the [place] ? 1   Count from 20 down to 1? 1   What address did I give you earlier? 0   Total Score 9   Interpretation of Total Score Greater than 6 Normal   Depression Screen   Little interest or pleasure in doing things. 0   Feeling down, depressed, or hopeless 0   PHQ 2 Total 0   Pain Score   Pain Score Four   Substance Use Screening   In Past 12 MONTHS, how often have you used any tobacco product (for example, cigarettes, e-cigarettes, cigars, pipes, or smokeless tobacco)? Never   In the PAST 12 MONTHS, how often have you had 5 (men)/4 (women) or more drinks containing alcohol in one day? Never   In the PAST 12 months, how often have you used any prescription medications just for the feeling, more than prescribed, or that were not prescribed for you? Prescriptions may include: opioids, benzodiazepines, medications for ADHD Never   In the PAST 12 MONTHS, how often have you used any drugs, including marijuana, cocaine or  crack, heroin, methamphetamine, hallucinogens, ecstasy/MDMA? Never   Hearing Screen   Have you noticed any hearing difficulties? No   After whispering 9-1-6 how many numbers did the patient repeat correctly? 3   Fall Risk Assessment   Do you feel unsteady when standing or walking? Yes   Do you worry about falling? Yes   Have you fallen in the past year? Yes   How many times have you fallen? Once   Were you ever injured from falling? Yes   Urinary Incontinence Screen   Do you ever leak urine when you don't want to? YES

## 2023-09-08 NOTE — Progress Notes (Signed)
 FAMILY MEDICINE, MEDICAL OFFICE BUILDING  [address]  [place] [address]  Operated by [facility]  Medicare Annual Wellness Visit    Name: Judith Walton MRN:  [id number]   Date: 09/08/2023 Age: 89 y.o.     SUBJECTIVE:   Judith  C Walton is a 89 y.o. female for presenting for Medicare Wellness exam.   I have reviewed and reconciled the medication list with the patient today.        09/08/2023    10:26 AM   Comprehensive Health Assessment-Adult   Do you wish to complete this form? Yes   During the past 4 weeks, how would you rate your health in general? Very Good   During the past 4 weeks, how much difficulty have you had doing your usual activities inside and outside your home because of medical or emotional problems? No difficulty at all   During the past 4 weeks, was someone available to help you if you needed and wanted help? Yes, as much as I wanted   In the past year, how many times have you gone to the emergency department or been admitted to a hospital for a health problem? None   Are you generally satisfied with your sleep? Yes   Do you have enough money to buy things you need in everyday life, such as food, clothing, medicines, and housing? Yes, always   Can you get to places beyond walking distance without help?  (For example, can you drive your own car or travel alone on buses)? Yes   Do you fasten your seatbelt when you are in a car? Yes, usually   Do you exercise 20 minutes 3 or more days per week (such as walking, dancing, biking, mowing grass, swimming)? Yes, most of the time   How often do you eat food that is healthy (fruits, vegetables, lean meats) instead of unhealthy (sweets, fast food, junk food, fatty foods)? Some of the time   Have your parents, brothers or sisters had any of the following problems before the age of 55? (check all that apply) Heart problems, or hardening of the arteries;Cancer   How often do you have trouble taking medicines the eay you are told to  take them? I do not take any medicines   Do you need any help communicating with your doctors and nurses because of vision or hearing problems? No   During the past 12 months, have you experienced confusion or memory loss that is happening more often or is getting worse? No   Do you have one person you think of as your personal doctor (primary care provider or family doctor)? Yes   If you are seeing a Primary Care Provider (PCP) or family doctor. please list their name Willadean Hark   Are you now also seeing any specialist physician(s) (such as eye doctor, foot doctor, skin doctor)? No   How confident are you that you can control or manage most of your health problems? Somewhat confident     I have reviewed and updated as appropriate the past medical, family and social history. 09/08/2023 as summarized below:  Past Medical History:   Diagnosis Date    Coronary artery disease involving native coronary artery of native heart     S/P MI and stent placement    DDD (degenerative disc disease), lumbar     Foraminal stenosis of lumbar region     Migraine     Seborrheic keratoses      Past  Surgical History:   Procedure Laterality Date    Cardiac catheterization  2007    Hx hysterectomy  1975    Hx tonsillectomy      Hx wisdom teeth extraction      Mouth surgery       Current Outpatient Medications   Medication Sig    ascorbic acid (VITAMIN C) 1,000 mg Oral Tablet Take 1 Tablet (1,000 mg total) by mouth Daily    cholecalciferol, vitamin D3, 250 mcg (10,000 unit) Oral Capsule Take 1 Capsule (10,000 Units total) by mouth Daily    Ibuprofen  (MOTRIN ) 600 mg Oral Tablet Take 1 Tablet (600 mg total) by mouth Four times a day as needed for Pain    propranoloL  (INDERAL  LA) 60 mg Oral Capsule,Sustained Action 24 hr Take 1 Capsule (60 mg total) by mouth Daily    vitamin B complex Oral Tablet Take 1 Tablet by mouth Daily    zinc Sulfate (ZINCATE) 50 mg zinc (220 mg) Oral Tablet Take 1 Tablet (50 mg total) by mouth Daily     Family  Medical History:       Problem Relation (Age of Onset)    Cancer Other    Heart Disease Other    Hypertension (High Blood Pressure) Other    Migraines Other    No Known Problems Father, Brother, Brother, Brother    Osteoporosis Mother          Social History     Socioeconomic History    Marital status: Widowed   Tobacco Use    Smoking status: Never    Smokeless tobacco: Never   Vaping Use    Vaping status: Never Used   Substance and Sexual Activity    Alcohol use: Never    Drug use: Never     Social Determinants of Health     Health Literacy: Low Risk  (03/10/2023)    Health Literacy     SDOH Health Literacy: Never       List of Current Health Care Providers   Care Team       PCP       Name Type Specialty Phone Number    Willadean Hark, [place] Nurse Practitioner FAMILY NURSE PRACTITIONER [phone]              Care Team       No care team found                      Health Maintenance   Topic Date Due    Shingles Vaccine (1 of 2) 03/09/2024 (Originally [birth year]/01/1986)    Pneumococcal Vaccination, Age 23+ (1 of 1 - PCV) 03/09/2024 (Originally 02/01/1986)    Influenza Vaccine (Season Ended) 01/12/2024    Depression Screening  09/07/2024    Medicare Annual Wellness Visit  09/07/2024    Adult Tdap-Td (2 - Td or Tdap) 08/17/2027    Osteoporosis screening  07/05/2031    Covid-19 Vaccine  Discontinued    RSV Adult 60+ or Pregnancy  Discontinued     Medicare Wellness Assessment   Medicare initial or wellness physical in the last year?: Yes  Advance Directives   Does patient have a living will or MPOA: No           Advance directive information given to the patient today?: Yes      Activities of Daily Living   Do you need help with dressing, bathing, or walking?: No   Do you need help with  shopping, housekeeping, medications, or finances?: No   Do you have rugs in hallways, broken steps, or poor lighting?: No   Do you have grab bars in your bathroom, non-slip strips in your tub, and hand rails on your stairs?: Yes    Cognitive Function Screen (1=Yes, 0=No)   What is you age?: Correct   What is the time to the nearest hour?: Correct   What is the year?: Correct   What is the name of this clinic?: Correct   Can the patient recognize two persons (the doctor, the nurse, home help, etc.)?: Correct   What is the date of your birth? (day and month sufficient) : Correct   In what year did World War II end?: Correct   Who is the current president of the [place] ?: Correct   Count from 20 down to 1?: Correct   What address did I give you earlier?: Incorrect   Total Score: 9   Interpretation of Total Score: Greater than 6 Normal   Fall Risk Screen   Do you feel unsteady when standing or walking?: Yes  Do you worry about falling?: Yes  Have you fallen in the past year?: Yes  How many times have you fallen?: Once  Were you ever injured from falling?: Yes   Depression Screen     Little interest or pleasure in doing things.: Not at all  Feeling down, depressed, or hopeless: Not at all  PHQ 2 Total: 0     Pain Score   Pain Score:   4    Substance Use-Abuse Screening     Tobacco Use     In Past 12 MONTHS, how often have you used any tobacco product (for example, cigarettes, e-cigarettes, cigars, pipes, or smokeless tobacco)?: Never     Alcohol use     In the PAST 12 MONTHS, how often have you had 5 (men)/4 (women) or more drinks containing alcohol in one day?: Never     Prescription Drug Use     In the PAST 12 months, how often have you used any prescription medications just for the feeling, more than prescribed, or that were not prescribed for you? Prescriptions may include: opioids, benzodiazepines, medications for ADHD: Never           Illicit Drug Use   In the PAST 12 MONTHS, how often have you used any drugs, including marijuana, cocaine or crack, heroin, methamphetamine, hallucinogens, ecstasy/MDMA?: Never        Hearing Screen   Have you noticed any hearing difficulties?: No  After whispering 9-1-6 how many numbers did the patient  repeat correctly?: 3     Vision Screen             Urine Incontinence Screen   Urinary Incontinence Screen  Do you ever leak urine when you don't want to?: YES     No Known Allergies     OBJECTIVE:   BP 137/72   Pulse 86   Temp 36.8 C (98.2 F) (Temporal)   Ht 1.549 m (5\' 1" )   Wt 68.9 kg (152 lb)   SpO2 98%   BMI 28.72 kg/m     Physical Exam  Vitals reviewed.   Constitutional:       General: She is not in acute distress.     Appearance: Normal appearance.   Cardiovascular:      Rate and Rhythm: Normal rate and regular rhythm.      Pulses: Normal pulses.  Heart sounds: Normal heart sounds.   Pulmonary:      Effort: Pulmonary effort is normal.      Breath sounds: Normal breath sounds.   Abdominal:      General: Bowel sounds are normal.      Palpations: Abdomen is soft.   Musculoskeletal:      Left wrist: Bony tenderness present. Decreased range of motion.   Skin:     General: Skin is warm and dry.      Capillary Refill: Capillary refill takes less than 2 seconds.   Neurological:      General: No focal deficit present.      Mental Status: She is alert and oriented to person, place, and time. Mental status is at baseline.   Psychiatric:         Attention and Perception: Attention normal.         Mood and Affect: Mood and affect normal.         Speech: Speech normal.         Behavior: Behavior normal. Behavior is cooperative.         Thought Content: Thought content normal.     There are no preventive care reminders to display for this patient.     ASSESSMENT & PLAN:   Assessment/Plan   1. Advanced care planning/counseling discussion    2. Medicare annual wellness visit, subsequent    3. Postmenopausal status    4. At high risk for osteoporosis       Identified Risk Factors/ Recommended Actions   Fall Risk Follow up plan of care: Vitamin D supplementation advised  Discussed optimizing home safety    The PHQ 2 Total: 0 depression screen is interpreted as negative.    Urinary Incontinence Plan of Care:  Behavioral interventions with bladder training, pelvic floor muscle training, and/or prompted voiding  Addressed comorbid conditions     Advanced Directives: Patient agreeable to - Advanced Directives discussed and patient elected to take home to review.     The patient has been educated about risk factors and recommended preventive care. Written Prevention Plan completed/ updated and given to patient (see After Visit Summary).  Return in 1 year (on 09/07/2024) for Medicare Well Visit.  Willadean Hark, APRN,NP-C

## 2023-09-09 ENCOUNTER — Encounter (INDEPENDENT_AMBULATORY_CARE_PROVIDER_SITE_OTHER): Payer: Self-pay

## 2023-09-09 DIAGNOSIS — Z5320 Procedure and treatment not carried out because of patient's decision for unspecified reasons: Secondary | ICD-10-CM | POA: Insufficient documentation

## 2023-09-09 NOTE — Nursing Note (Signed)
 Message  Received: Today  McDonald, Caleb, APRN,NP-C  Lukka Black, LPN  She is refusing it as of now. Just isn't a big fan of medications. Wasn't taking any prescription medicines until yesterday.          Previous Messages       ----- Message -----  From: Deshara Rossi, LPN  Sent: 1/61/0960   9:06 AM EDT  To: Taneeka Curtner, LPN; *    Can you give me the reason Statin for CVD was/was not discussed during visit yesterday? I will do documentation to try and get her excluded.  Thanks!!!

## 2023-10-13 ENCOUNTER — Ambulatory Visit: Payer: Self-pay

## 2023-10-13 ENCOUNTER — Other Ambulatory Visit: Payer: Self-pay

## 2023-10-13 ENCOUNTER — Encounter (INDEPENDENT_AMBULATORY_CARE_PROVIDER_SITE_OTHER): Payer: Self-pay

## 2023-10-13 VITALS — BP 124/65 | HR 76 | Temp 98.2°F | Resp 16 | Ht 61.0 in | Wt 152.0 lb

## 2023-10-13 DIAGNOSIS — R351 Nocturia: Secondary | ICD-10-CM | POA: Insufficient documentation

## 2023-10-13 DIAGNOSIS — R252 Cramp and spasm: Secondary | ICD-10-CM | POA: Insufficient documentation

## 2023-10-13 DIAGNOSIS — I2510 Atherosclerotic heart disease of native coronary artery without angina pectoris: Secondary | ICD-10-CM | POA: Insufficient documentation

## 2023-10-13 DIAGNOSIS — L989 Disorder of the skin and subcutaneous tissue, unspecified: Secondary | ICD-10-CM | POA: Insufficient documentation

## 2023-10-13 LAB — MAGNESIUM: MAGNESIUM: 2.1 mg/dL (ref 1.9–2.7)

## 2023-10-13 LAB — CBC WITH DIFF
BASOPHIL #: 0.1 10*3/uL (ref 0.00–0.10)
BASOPHIL %: 1 % (ref 0–1)
EOSINOPHIL #: 0.3 10*3/uL (ref 0.00–0.50)
EOSINOPHIL %: 3 % (ref 1–7)
HCT: 40.6 % (ref 31.2–41.9)
HGB: 13.6 g/dL (ref 10.9–14.3)
LYMPHOCYTE #: 2 10*3/uL (ref 1.10–3.10)
LYMPHOCYTE %: 23 % (ref 16–46)
MCH: 29.4 pg (ref 24.7–32.8)
MCHC: 33.4 g/dL (ref 32.3–35.6)
MCV: 87.9 fL (ref 75.5–95.3)
MONOCYTE #: 0.7 10*3/uL (ref 0.20–0.90)
MONOCYTE %: 8 % (ref 4–11)
MPV: 10.5 fL (ref 7.9–10.8)
NEUTROPHIL #: 5.7 10*3/uL (ref 1.90–8.20)
NEUTROPHIL %: 65 % (ref 43–77)
PLATELETS: 301 10*3/uL (ref 140–440)
RBC: 4.62 10*6/uL (ref 3.63–4.92)
RDW: 14.1 % (ref 12.3–17.7)
WBC: 8.8 10*3/uL (ref 3.8–11.8)

## 2023-10-13 LAB — HEPATIC FUNCTION PANEL
ALBUMIN/GLOBULIN RATIO: 1.5 — ABNORMAL HIGH (ref 0.8–1.4)
ALBUMIN: 4 g/dL (ref 3.5–5.7)
ALKALINE PHOSPHATASE: 52 U/L (ref 34–104)
ALT (SGPT): 14 U/L (ref 7–52)
AST (SGOT): 15 U/L (ref 13–39)
BILIRUBIN DIRECT: 0.04 md/dL (ref 0.03–0.18)
BILIRUBIN TOTAL: 0.4 mg/dL (ref 0.3–1.0)
BILIRUBIN, INDIRECT: 0.36 mg/dL (ref <=1)
GLOBULIN: 2.7 (ref 2.0–3.5)
PROTEIN TOTAL: 6.7 g/dL (ref 6.4–8.9)

## 2023-10-13 LAB — BASIC METABOLIC PANEL
ANION GAP: 5 mmol/L (ref 4–13)
BUN/CREA RATIO: 27 — ABNORMAL HIGH (ref 6–22)
BUN: 23 mg/dL (ref 7–25)
CALCIUM: 9 mg/dL (ref 8.6–10.3)
CHLORIDE: 105 mmol/L (ref 98–107)
CO2 TOTAL: 30 mmol/L (ref 21–31)
CREATININE: 0.84 mg/dL (ref 0.60–1.30)
ESTIMATED GFR: 67 mL/min/{1.73_m2} (ref >59)
GLUCOSE: 86 mg/dL (ref 74–109)
OSMOLALITY, CALCULATED: 282 mosm/kg (ref 270–290)
POTASSIUM: 4.1 mmol/L (ref 3.5–5.1)
SODIUM: 140 mmol/L (ref 136–145)

## 2023-10-13 LAB — LIPID PANEL
CHOL/HDL RATIO: 3.8
CHOLESTEROL: 222 mg/dL — ABNORMAL HIGH (ref <200)
HDL CHOL: 59 mg/dL (ref >=40)
LDL CALC: 141 mg/dL — ABNORMAL HIGH (ref 0–100)
TRIGLYCERIDES: 110 mg/dL (ref <=150)
VLDL CALC: 22 mg/dL (ref 0–50)

## 2023-10-13 MED ORDER — OXYBUTYNIN CHLORIDE ER 10 MG TABLET,EXTENDED RELEASE 24 HR
10.0000 mg | EXTENDED_RELEASE_TABLET | Freq: Every day | ORAL | 0 refills | Status: DC
Start: 2023-10-13 — End: 2024-01-09

## 2023-10-13 MED ORDER — HYDROXYZINE HCL 10 MG TABLET
10.0000 mg | ORAL_TABLET | Freq: Three times a day (TID) | ORAL | 0 refills | Status: AC | PRN
Start: 2023-10-13 — End: 2023-10-23

## 2023-10-13 NOTE — Nursing Note (Signed)
 10/13/23 0947   Medicare Wellness Assessment   Medicare initial or wellness physical in the last year? Yes   Advance Directives   Does patient have a living will or MPOA No   Advance directive information given to the patient today? Patient Declined   Activities of Daily Living   Do you need help with dressing, bathing, or walking? No   Do you need help with shopping, housekeeping, medications, or finances? No   Do you have rugs in hallways, broken steps, or poor lighting? No   Do you have grab bars in your bathroom, non-slip strips in your tub, and hand rails on your stairs? Yes   Cognitive Function Screen   What is you age? 1   What is the time to the nearest hour? 1   Remember this address: [address]   What is the year? 1   What is the name of this clinic? 1   Can the patient recognize two persons (the doctor, the nurse, home help, etc.)? 1   What is the date of your birth? (day and month sufficient)  1   In what year did World War II end? 1   Who is the current president of the [place] ? 1   Count from 20 down to 1? 1   What address did I give you earlier? 0   Total Score 9   Interpretation of Total Score Greater than 6 Normal   Depression Screen   Little interest or pleasure in doing things. 0   Feeling down, depressed, or hopeless 0   PHQ 2 Total 0   Pain Score   Pain Score Zero   Substance Use Screening   In Past 12 MONTHS, how often have you used any tobacco product (for example, cigarettes, e-cigarettes, cigars, pipes, or smokeless tobacco)? Never   In the PAST 12 MONTHS, how often have you had 5 (men)/4 (women) or more drinks containing alcohol in one day? Never   In the PAST 12 months, how often have you used any prescription medications just for the feeling, more than prescribed, or that were not prescribed for you? Prescriptions may include: opioids, benzodiazepines, medications for ADHD Never   In the PAST 12 MONTHS, how often have you used any drugs, including marijuana,  cocaine or crack, heroin, methamphetamine, hallucinogens, ecstasy/MDMA? Never   Fall Risk Assessment   Do you feel unsteady when standing or walking? Yes   Do you worry about falling? Yes   Have you fallen in the past year? Yes   How many times have you fallen? Once   Were you ever injured from falling? Yes   Urinary Incontinence Screen   Do you ever leak urine when you don't want to? YES

## 2023-10-13 NOTE — Nursing Note (Signed)
 10/13/23 0945   Comprehensive Health Assessment-Adult   Do you wish to complete this form? Yes   During the past 4 weeks, how would you rate your health in general? Good   During the past 4 weeks, how much difficulty have you had doing your usual activities inside and outside your home because of medical or emotional problems? A little bit of difficulty   During the past 4 weeks, was someone available to help you if you needed and wanted help? Yes, as much as I wanted   In the past year, how many times have you gone to the emergency department or been admitted to a hospital for a health problem? 1 time   Are you generally satisfied with your sleep? Yes   Do you have enough money to buy things you need in everyday life, such as food, clothing, medicines, and housing? Yes, always   Can you get to places beyond walking distance without help?  (For example, can you drive your own car or travel alone on buses)? Yes   Do you fasten your seatbelt when you are in a car? Yes, usually   Do you exercise 20 minutes 3 or more days per week (such as walking, dancing, biking, mowing grass, swimming)? Yes, most of the time   How often do you eat food that is healthy (fruits, vegetables, lean meats) instead of unhealthy (sweets, fast food, junk food, fatty foods)? Most of the time   Have your parents, brothers or sisters had any of the following problems before the age of 19? (check all that apply) High cholesterol;Cancer   How often do you have trouble taking medicines the eay you are told to take them? I always take them as prescribed   Do you need any help communicating with your doctors and nurses because of vision or hearing problems? No   During the past 12 months, have you experienced confusion or memory loss that is happening more often or is getting worse? No   Do you have one person you think of as your personal doctor (primary care provider or family doctor)? Yes   If you are seeing a Primary Care Provider (PCP) or  family doctor. please list their name Willadean Hark   Are you now also seeing any specialist physician(s) (such as eye doctor, foot doctor, skin doctor)? No   How confident are you that you can control or manage most of your health problems? Very confident

## 2023-10-13 NOTE — Nursing Note (Signed)
 10/13/23 0945   Domestic Violence   Because we are aware of abuse and domestic violence today, we ask all patients: Are you being hurt, hit, or frightened by anyone at your home or in your life?  N   Basic Needs   Do you have any basic needs within your home that are not being met? (such as Food, Shelter, [employer] fast streamer, Tranportation, paying for bills and/or medications) N

## 2023-10-13 NOTE — Progress Notes (Signed)
 FAMILY MEDICINE, MEDICAL OFFICE BUILDING  [address]  [place] [address]  Operated by [facility]    [name]  Judith Walton  1933-10-25  [id number]    Date of Service: 10/13/2023  9:30 AM EDT    Chief complaint:   Chief Complaint   Patient presents with    Medicare Annual    Labs Only     Subjective:   This is a case of a 89 y.o. female who presents today for complaint(s) of: Generalized itching, but states that it has been worst on her back. Muscle cramps that patient had attributed to hypomagnesemia. She began taking a magnesium supplement, but would like to have her magnesium checked regardless. Nocturia. Patient states that she cannot get a good night sleep because she is up to urinate about 5 times each night. She claims this has been a longstanding issue and denies dysuria.    History:  Active Ambulatory Problems     Diagnosis Date Noted    DDD (degenerative disc disease), lumbar     Migraine     Coronary artery disease involving native coronary artery of native heart     Seborrheic keratoses     Essential tremor 09/08/2023    Left wrist fracture, with malunion, subsequent encounter 09/08/2023    Refusal of statin medication by patient 09/09/2023     Resolved Ambulatory Problems     Diagnosis Date Noted    No Resolved Ambulatory Problems     Past Medical History:   Diagnosis Date    Foraminal stenosis of lumbar region      Current Outpatient Medications   Medication Sig    ascorbic acid (VITAMIN C) 1,000 mg Oral Tablet Take 1 Tablet (1,000 mg total) by mouth Daily    cholecalciferol, vitamin D3, 250 mcg (10,000 unit) Oral Capsule Take 1 Capsule (10,000 Units total) by mouth Daily    hydrOXYzine HCL (ATARAX) 10 mg Oral Tablet Take 1 Tablet (10 mg total) by mouth Every 8 hours as needed for Itching for up to 10 days    Ibuprofen  (MOTRIN ) 600 mg Oral Tablet Take 1 Tablet (600 mg total) by mouth Four times a day as needed for Pain    magnesium oxide (MAG-OX) 400 mg Oral Tablet Take 1 Tablet (400  mg total) by mouth Twice daily    oxyBUTYnin chloride (DITROPAN XL) 10 mg Oral Tablet Extended Rel 24 hr Take 1 Tablet (10 mg total) by mouth Daily Indications: overactive bladder    propranoloL  (INDERAL  LA) 60 mg Oral Capsule,Sustained Action 24 hr Take 1 Capsule (60 mg total) by mouth Daily    vitamin B complex Oral Tablet Take 1 Tablet by mouth Daily    zinc Sulfate (ZINCATE) 50 mg zinc (220 mg) Oral Tablet Take 1 Tablet (50 mg total) by mouth Daily     Review of Systems:  Any pertinent Review of Systems as addressed in the HPI above.    Objective   BP 124/65   Pulse 76   Temp 36.8 C (98.2 F) (Temporal)   Resp 16   Ht 1.549 m (5\' 1" )   Wt 68.9 kg (152 lb)   SpO2 97%   BMI 28.72 kg/m     BMI addressed: Advised on diet, weight loss, and exercise to reduce above normal BMI.     Physical Exam  Vitals reviewed.   Constitutional:       General: She is not in acute distress.     Appearance: Normal appearance.  Cardiovascular:      Rate and Rhythm: Normal rate and regular rhythm.      Pulses: Normal pulses.      Heart sounds: Normal heart sounds.   Pulmonary:      Effort: Pulmonary effort is normal.      Breath sounds: Normal breath sounds.   Skin:     General: Skin is warm and dry.      Capillary Refill: Capillary refill takes less than 2 seconds.   Neurological:      General: No focal deficit present.      Mental Status: She is alert and oriented to person, place, and time. Mental status is at baseline.   Psychiatric:         Attention and Perception: Attention normal.         Mood and Affect: Mood and affect normal.         Speech: Speech normal.         Behavior: Behavior normal. Behavior is cooperative.         Thought Content: Thought content normal.        Data Reviewed:  COMPLETE BLOOD COUNT   Lab Results   Component Value Date    WBC 8.2 03/10/2023    HGB 13.4 03/10/2023    HCT 40.0 03/10/2023    PLTCNT 344 03/10/2023     DIFFERENTIAL  Lab Results   Component Value Date    PMNS 66 03/10/2023     LYMPHOCYTES 25 03/10/2023    MONOCYTES 6 03/10/2023    EOSINOPHIL 2 03/10/2023    BASOPHILS 1 03/10/2023    BASOPHILS 0.10 03/10/2023    PMNABS 5.40 03/10/2023    LYMPHSABS 2.10 03/10/2023    EOSABS 0.10 03/10/2023    MONOSABS 0.50 03/10/2023     BILIRUBIN  Lab Results   Component Value Date    TOTBILIRUBIN 0.4 03/10/2023     LIPID PROFILE  Lab Results   Component Value Date    CHOLESTEROL 258 (H) 03/10/2023    HDLCHOL 69 03/10/2023    LDLCHOL 166 (H) 03/10/2023    TRIG 115 03/10/2023     LIVER TESTS  Lab Results   Component Value Date    ALBUMIN 4.2 03/10/2023    AST 14 03/10/2023    ALT 14 03/10/2023    ALKPHOS 56 03/10/2023    TOTBILIRUBIN 0.4 03/10/2023    BILIRUBINCON <0.10 03/10/2023     THYROID  STIMULATING HORMONE  Lab Results   Component Value Date    TSH 1.254 03/10/2023   Recent lab results reviewed and noted.  Assessment & Plan  Skin lesion  Multiple, scattered skin lesions. Rough to touch. Non-painful, non-pruritic. Patient states that they have been there for a long time and have been changing in size, but not shape or color. Referral placed to Dermatology.  Leg cramp  Serum magnesium ordered.  Nocturia  Patient advised on the available treatment options. Ditropan order placed.    The patient was given the opportunity to ask questions and those questions were answered to the patient's satisfaction. The patient was encouraged to call with any additional questions or concerns. I instructed the patient to follow-up if symptoms persist or worsen. Medication safety was discussed. A good faith effort was made to reconcile the patient's medications. More than 50% of the visit was spent counseling and coordinating care.    Orders Placed This Encounter    MAGNESIUM    CBC WITH DIFF    Referral to  External Provider    oxyBUTYnin chloride (DITROPAN XL) 10 mg Oral Tablet Extended Rel 24 hr    hydrOXYzine HCL (ATARAX) 10 mg Oral Tablet     Depression screening is negative. PHQ 2 Total: 0     Follow up: Return in  about 1 year (around 10/12/2024) for Medicare wellness.  Willadean Hark, APRN,NP-C

## 2023-10-14 ENCOUNTER — Ambulatory Visit (INDEPENDENT_AMBULATORY_CARE_PROVIDER_SITE_OTHER): Payer: Self-pay

## 2023-10-15 ENCOUNTER — Encounter (INDEPENDENT_AMBULATORY_CARE_PROVIDER_SITE_OTHER): Payer: Self-pay

## 2023-10-26 ENCOUNTER — Other Ambulatory Visit (INDEPENDENT_AMBULATORY_CARE_PROVIDER_SITE_OTHER): Payer: Self-pay

## 2024-01-09 ENCOUNTER — Other Ambulatory Visit (INDEPENDENT_AMBULATORY_CARE_PROVIDER_SITE_OTHER): Payer: Self-pay

## 2024-02-13 ENCOUNTER — Ambulatory Visit (HOSPITAL_COMMUNITY)

## 2024-02-13 ENCOUNTER — Ambulatory Visit (HOSPITAL_BASED_OUTPATIENT_CLINIC_OR_DEPARTMENT_OTHER): Admission: RE | Admit: 2024-02-13 | Discharge: 2024-02-13 | Disposition: A | Source: Ambulatory Visit

## 2024-02-13 ENCOUNTER — Ambulatory Visit: Payer: Self-pay

## 2024-02-13 ENCOUNTER — Other Ambulatory Visit: Payer: Self-pay

## 2024-02-13 ENCOUNTER — Ambulatory Visit (INDEPENDENT_AMBULATORY_CARE_PROVIDER_SITE_OTHER): Payer: Self-pay

## 2024-02-13 ENCOUNTER — Encounter (INDEPENDENT_AMBULATORY_CARE_PROVIDER_SITE_OTHER): Payer: Self-pay

## 2024-02-13 VITALS — BP 171/84 | HR 79 | Temp 98.1°F | Wt 156.0 lb

## 2024-02-13 DIAGNOSIS — R0609 Other forms of dyspnea: Secondary | ICD-10-CM | POA: Insufficient documentation

## 2024-02-13 LAB — CBC WITH DIFF
BASOPHIL #: 0.1 x10ˆ3/uL (ref 0.00–0.10)
BASOPHIL %: 1 % (ref 0–1)
EOSINOPHIL #: 0.3 x10ˆ3/uL (ref 0.00–0.50)
EOSINOPHIL %: 3 % (ref 1–7)
HCT: 37.6 % (ref 31.2–41.9)
HGB: 13 g/dL (ref 10.9–14.3)
LYMPHOCYTE #: 2.5 x10ˆ3/uL (ref 1.10–3.10)
LYMPHOCYTE %: 24 % (ref 16–46)
MCH: 30.3 pg (ref 24.7–32.8)
MCHC: 34.6 g/dL (ref 32.3–35.6)
MCV: 87.6 fL (ref 75.5–95.3)
MONOCYTE #: 0.9 x10ˆ3/uL (ref 0.20–0.90)
MONOCYTE %: 9 % (ref 4–11)
MPV: 10.2 fL (ref 7.9–10.8)
NEUTROPHIL #: 6.5 x10ˆ3/uL (ref 1.90–8.20)
NEUTROPHIL %: 64 % (ref 43–77)
PLATELETS: 254 x10ˆ3/uL (ref 140–440)
RBC: 4.29 x10ˆ6/uL (ref 3.63–4.92)
RDW: 14.2 % (ref 12.3–17.7)
WBC: 10.2 x10ˆ3/uL (ref 3.8–11.8)

## 2024-02-13 LAB — B-TYPE NATRIURETIC PEPTIDE (BNP),PLASMA: BNP: 415 pg/mL — ABNORMAL HIGH (ref 1–100)

## 2024-02-13 MED ORDER — FUROSEMIDE 20 MG TABLET: 20.0000 mg | ORAL_TABLET | Freq: Every day | ORAL | 0 refills | Status: DC

## 2024-02-13 NOTE — Progress Notes (Signed)
 FAMILY MEDICINE, MEDICAL OFFICE BUILDING  [address]  [place] [address]  Operated by [facility]    [place]  LAURIANNE FLORESCA  1933/08/28  [id number]    Date of Service: 02/13/2024  1:00 PM EDT  Chief complaint:   Chief Complaint   Patient presents with    Shortness of Breath     Pt states when she goes up or down stairs, talks, or anything she's short of breath     Subjective:   History of Present Illness    Judith  C Walton is an 89 year old female with a history of myocardial infarction and stent placement who presents with shortness of breath and dyspnea on exertion.    She has been experiencing shortness of breath and dyspnea on exertion for the past two months, particularly when climbing stairs, talking, or engaging in any physical activity. These symptoms began after contracting a virus from her granddaughter at the start of the school year and have progressively worsened since then.    She describes a sensation of incomplete breathing and a feeling of something in her throat that won't come up, although she only has a slight cough. No fever is present. She has a history of myocardial infarction and stent placement in 2007, but states that her current symptoms are not as severe as those experienced during that time.    She has noticed a slight weight gain despite reducing her food intake and reports mild swelling, primarily in her lower extremities. She also mentions increased thirst but has not significantly increased her water intake. Her coffee consumption has been reduced to one cup a day.    No fever or significant cough, but she describes nasal congestion in the mornings. She questions whether allergies could be contributing to her symptoms.        Review of Systems:  Any pertinent Review of Systems as addressed in the HPI above.    Objective   BP (!) 171/84   Pulse 79   Temp 36.7 C (98.1 F) (Temporal)   Wt 70.8 kg (156 lb)   SpO2 96%   BMI 29.48 kg/m     Physical Exam     CONSTITUTIONAL: Patient is not in acute distress. Normal appearance.  HENT: Mucous membranes are moist. Pupils are equal, round, and reactive to light.  CARDIOVASCULAR: Normal rate and regular rhythm. Normal pulses. Normal heart sounds. No murmur heard.  PULMONARY: Pulmonary effort is normal. Lungs clear to auscultation bilaterally.  MUSCULOSKELETAL: No swelling or deformity. No tenderness. Right lower leg- mild edema. Left lower leg- mild edema.  SKIN: Skin is warm and dry.  NEUROLOGICAL: No focal deficit present. She is alert and oriented to person, place, and time.  PSYCHIATRIC: Mood and affect normal.       ICD-10-CM    1. Dyspnea on exertion  R06.09 XR CHEST PA AND LATERAL     B-TYPE NATRIURETIC PEPTIDE (BNP),PLASMA     CBC/DIFF         Assessment & Plan     Dyspnea on exertion with associated weight gain, lower extremity swelling, nonproductive cough, and increased thirst  Worsening dyspnea with weight gain, edema, cough, and thirst.   - Order chest x-ray for pleural effusion or abnormalities.  - Order CBC for hemoglobin assessment.  - Order BNP for CHF evaluation.  - Follow up with results for management, consider steroids, inhaler, antibiotics, or diuretics.          The patient was given the  opportunity to ask questions and those questions were answered to the patient's satisfaction. The patient was encouraged to call with any additional questions or concerns. I instructed the patient to follow-up if symptoms persist or worsen. Medication safety was discussed. A good faith effort was made to reconcile the patient's medications. More than 50% of the visit was spent counseling and coordinating care.    Orders Placed This Encounter    XR CHEST PA AND LATERAL    B-TYPE NATRIURETIC PEPTIDE (BNP),PLASMA    CBC/DIFF     Follow up: Return if symptoms worsen or fail to improve.  [facility], APRN, CNP   This note was created with assistance from [employer] via capture of conversational audio. Consent was obtained  from the patient and all parties present prior to recording.
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
## 2024-02-16 ENCOUNTER — Encounter (INDEPENDENT_AMBULATORY_CARE_PROVIDER_SITE_OTHER): Payer: Self-pay

## 2024-03-05 ENCOUNTER — Other Ambulatory Visit (INDEPENDENT_AMBULATORY_CARE_PROVIDER_SITE_OTHER): Payer: Self-pay

## 2024-03-09 ENCOUNTER — Ambulatory Visit (INDEPENDENT_AMBULATORY_CARE_PROVIDER_SITE_OTHER): Payer: Self-pay

## 2024-03-10 ENCOUNTER — Other Ambulatory Visit: Payer: Self-pay

## 2024-03-10 ENCOUNTER — Ambulatory Visit: Payer: Self-pay

## 2024-03-10 ENCOUNTER — Encounter (INDEPENDENT_AMBULATORY_CARE_PROVIDER_SITE_OTHER): Payer: Self-pay

## 2024-03-10 VITALS — BP 185/97 | HR 87 | Temp 98.7°F | Resp 16 | Wt 153.0 lb

## 2024-03-10 DIAGNOSIS — I499 Cardiac arrhythmia, unspecified: Secondary | ICD-10-CM | POA: Insufficient documentation

## 2024-03-10 DIAGNOSIS — K5900 Constipation, unspecified: Secondary | ICD-10-CM

## 2024-03-10 DIAGNOSIS — I10 Essential (primary) hypertension: Secondary | ICD-10-CM | POA: Insufficient documentation

## 2024-03-10 DIAGNOSIS — F321 Major depressive disorder, single episode, moderate: Secondary | ICD-10-CM | POA: Insufficient documentation

## 2024-03-10 DIAGNOSIS — R002 Palpitations: Secondary | ICD-10-CM | POA: Insufficient documentation

## 2024-03-10 DIAGNOSIS — F419 Anxiety disorder, unspecified: Secondary | ICD-10-CM | POA: Insufficient documentation

## 2024-03-10 DIAGNOSIS — I2510 Atherosclerotic heart disease of native coronary artery without angina pectoris: Secondary | ICD-10-CM | POA: Insufficient documentation

## 2024-03-10 DIAGNOSIS — I509 Heart failure, unspecified: Secondary | ICD-10-CM | POA: Insufficient documentation

## 2024-03-10 LAB — COMPREHENSIVE METABOLIC PANEL, NON-FASTING
ALBUMIN/GLOBULIN RATIO: 1.4 (ref 0.8–1.4)
ALBUMIN: 4.1 g/dL (ref 3.5–5.7)
ALKALINE PHOSPHATASE: 53 U/L (ref 34–104)
ALT (SGPT): 13 U/L (ref 7–52)
ANION GAP: 7 mmol/L (ref 4–13)
AST (SGOT): 11 U/L — ABNORMAL LOW (ref 13–39)
BILIRUBIN TOTAL: 0.5 mg/dL (ref 0.3–1.0)
BUN/CREA RATIO: 24 — ABNORMAL HIGH (ref 6–22)
BUN: 18 mg/dL (ref 7–25)
CALCIUM, CORRECTED: 9.3 mg/dL (ref 8.9–10.8)
CALCIUM: 9.4 mg/dL (ref 8.6–10.3)
CHLORIDE: 104 mmol/L (ref 98–107)
CO2 TOTAL: 30 mmol/L (ref 21–31)
CREATININE: 0.76 mg/dL (ref 0.60–1.30)
ESTIMATED GFR: 75 mL/min/1.73mˆ2 (ref >59)
GLOBULIN: 2.9 (ref 2.0–3.5)
GLUCOSE: 100 mg/dL (ref 74–109)
OSMOLALITY, CALCULATED: 283 mosm/kg (ref 270–290)
POTASSIUM: 4.4 mmol/L (ref 3.5–5.1)
PROTEIN TOTAL: 7 g/dL (ref 6.4–8.9)
SODIUM: 141 mmol/L (ref 136–145)

## 2024-03-10 LAB — THYROID STIMULATING HORMONE WITH FREE T4 REFLEX: TSH: 1.104 u[IU]/mL (ref 0.450–5.330)

## 2024-03-10 LAB — B-TYPE NATRIURETIC PEPTIDE (BNP),PLASMA: BNP: 152 pg/mL — ABNORMAL HIGH (ref 1–100)

## 2024-03-10 MED ORDER — ESCITALOPRAM 5 MG TABLET
5.0000 mg | ORAL_TABLET | Freq: Every day | ORAL | 1 refills | Status: AC
Start: 2024-03-10 — End: ?

## 2024-03-10 MED ORDER — LOSARTAN 50 MG TABLET
50.0000 mg | ORAL_TABLET | Freq: Every day | ORAL | 1 refills | Status: AC
Start: 2024-03-10 — End: ?
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
## 2024-03-10 NOTE — Progress Notes (Signed)
 PRN MEDICAL OFFICE [facility]  FAMILY MEDICINE, MEDICAL OFFICE BUILDING  [address]  [place] [address]  [phone]   Judith  MISAKI Walton  1933-12-22  [id number]    Date of Service: 03/10/2024  Chief complaint:   Chief Complaint   Patient presents with    Follow Up     Pt states she is sob, having to clear her throat, having swelling/having fluid ration, staying tired , having allergies      Subjective:   History of Present Illness    Judith Walton is an 89 year old female with coronary artery disease and class II congestive heart failure who presents for follow-up on chronic disease management. She is accompanied by a family member.    She experiences persistent fatigue and has noted some improvement in her breathing since starting Lasix on October 3rd. Her shortness of breath and dyspnea on exertion have lessened compared to a year ago. However, she reports a decline in her activity level following a significant life event around her birthday in September. She has been on Lasix for three weeks, and her BNP was previously elevated at 415. Her current medications include Lasix, vitamin D, ibuprofen , magnesium, propranolol , vitamin B, and zinc.    She has a history of coronary artery disease and has previously refused statins. She recalls having a stent placed in her heart and undergoing various tests at that time. She is concerned about her blood pressure, which has been high during her last two visits.    She describes symptoms of moderate to severe depression, including feeling down nearly every day, trouble falling asleep, feeling tired every day, and poor appetite. She also reports moderate anxiety, feeling nervous and anxious daily, and worrying excessively. She has a history of anxiety throughout her life.    She mentions a history of constipation, which she attributes to fluid loss from Lasix. She is concerned about increased urination. She has a history of lumbar degenerative disc  disease.    She is currently raising a granddaughter and has experienced significant stress over the summer due to family deaths. She has a history of essential tremors and seborrheic keratosis, which are not currently the focus of her visit.        Past Medical History:   Diagnosis Date    Coronary artery disease involving native coronary artery of native heart     S/P MI and stent placement    DDD (degenerative disc disease), lumbar     Foraminal stenosis of lumbar region     Migraine     Seborrheic keratoses      Past Surgical History:   Procedure Laterality Date    CARDIAC CATHETERIZATION  2007    Stent placement    HX HYSTERECTOMY  1975    HX TONSILLECTOMY      HX WISDOM TEETH EXTRACTION      MOUTH SURGERY      Dr Reid Boone oral surgery-sept 2022,cyst removal     Current Outpatient Medications   Medication Sig Dispense Refill    cholecalciferol, vitamin D3, 250 mcg (10,000 unit) Oral Capsule Take 1 Capsule (10,000 Units total) by mouth Daily      escitalopram oxalate (LEXAPRO) 5 mg Oral Tablet Take 1 Tablet (5 mg total) by mouth Daily 90 Tablet 1    furosemide (LASIX) 20 mg Oral Tablet Take 1 Tablet (20 mg total) by mouth Daily for 30 days 30 Tablet 0    Ibuprofen  (MOTRIN ) 600 mg Oral  Tablet TAKE 1 TABLET (600 MG TOTAL) BY MOUTH 4 TIMES A DAY AS NEEDED FOR PAIN 200 Tablet 0    losartan (COZAAR) 50 mg Oral Tablet Take 1 Tablet (50 mg total) by mouth Daily 90 Tablet 1    magnesium oxide (MAG-OX) 400 mg Oral Tablet Take 1 Tablet (400 mg total) by mouth Twice daily      propranoloL  (INDERAL  LA) 60 mg Oral Capsule,Sustained Action 24 hr TAKE 1 CAPSULE BY MOUTH EVERY DAY 90 Capsule 1    vitamin B complex Oral Tablet Take 1 Tablet by mouth Daily      zinc Sulfate (ZINCATE) 50 mg zinc (220 mg) Oral Tablet Take 1 Tablet (50 mg total) by mouth Daily       No current facility-administered medications for this visit.     Allergies[1]    Review of Systems:  Any pertinent Review of Systems as addressed in the HPI  above.    Objective:   BP (!) 185/97   Pulse 87   Temp 37.1 C (98.7 F) (Temporal)   Resp 16   Wt 69.4 kg (153 lb)   SpO2 97%   BMI 28.91 kg/m     BMI addressed: Advised on diet, weight loss, and exercise to reduce above normal BMI.     Physical Exam    CONSTITUTIONAL: Patient is not in acute distress. Normal appearance.  HENT: Mucous membranes are moist. Pupils are equal, round, and reactive to light.  CARDIOVASCULAR: Normal rate and regular rhythm. Normal pulses. Extrasystoles heard. No murmur heard.  PULMONARY: Pulmonary effort is normal. Lungs clear to auscultation bilaterally.  MUSCULOSKELETAL: No swelling or deformity. No tenderness.  SKIN: Skin is warm and dry.  NEUROLOGICAL: No focal deficit present. Alert and oriented to person, place, and time.  PSYCHIATRIC: Mood and affect normal.        Laboratory:  COMPLETE BLOOD COUNT   Lab Results   Component Value Date    WBC 10.2 02/13/2024    HGB 13.0 02/13/2024    HCT 37.6 02/13/2024    PLTCNT 254 02/13/2024     DIFFERENTIAL  Lab Results   Component Value Date    PMNS 64 02/13/2024    LYMPHOCYTES 24 02/13/2024    MONOCYTES 9 02/13/2024    EOSINOPHIL 3 02/13/2024    BASOPHILS 1 02/13/2024    BASOPHILS 0.10 02/13/2024    PMNABS 6.50 02/13/2024    LYMPHSABS 2.50 02/13/2024    EOSABS 0.30 02/13/2024    MONOSABS 0.90 02/13/2024     BASIC METABOLIC PANEL  Lab Results   Component Value Date    SODIUM 140 10/13/2023    POTASSIUM 4.1 10/13/2023    CHLORIDE 105 10/13/2023    CO2 30 10/13/2023    ANIONGAP 5 10/13/2023    BUN 23 10/13/2023    CREATININE 0.84 10/13/2023    BUNCRRATIO 27 (H) 10/13/2023    GFR 67 10/13/2023    CALCIUM 9.0 10/13/2023    GLUCOSENF 86 10/13/2023      LIPID PROFILE  Lab Results   Component Value Date    CHOLESTEROL 222 (H) 10/13/2023    HDLCHOL 59 10/13/2023    LDLCHOL 141 (H) 10/13/2023    TRIG 110 10/13/2023     LIVER TESTS  Lab Results   Component Value Date    ALBUMIN 4.0 10/13/2023    AST 15 10/13/2023    ALT 14 10/13/2023    ALKPHOS 52  10/13/2023    TOTBILIRUBIN 0.4 10/13/2023  BILIRUBINCON 0.04 10/13/2023     THYROID  STIMULATING HORMONE  Lab Results   Component Value Date    TSH 1.254 03/10/2023      Results    LABS  BNP: 415 (02/13/2024)    RADIOLOGY  Chest x-ray: Negative for acute findings (02/13/2024)    DIAGNOSTIC  EKG: NSR (03/10/2024)        Most recent results reviewed with patient.      ICD-10-CM    1. Hypertension, unspecified type  I10 COMPREHENSIVE METABOLIC PANEL, NON-FASTING     THYROID  STIMULATING HORMONE WITH FREE T4 REFLEX      2. 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 Coronary artery disease involving native coronary artery of native heart  I25.10       4. Moderate anxiety  F41.9       5. Congestive heart failure, NYHA class 2, unspecified congestive heart failure type (CMS HCC)  I50.9 B-TYPE NATRIURETIC PEPTIDE (BNP),PLASMA     TRANSTHORACIC ECHOCARDIOGRAM - ADULT      6. Cardiac arrhythmia, unspecified cardiac arrhythmia type  I49.9 EKG (93000) - SAME DAY- Performed in Office     3 DAY EXTENDED HOLTER MONITOR      7. Palpitations  R00.2 3 DAY EXTENDED HOLTER MONITOR         Assessment & Plan     Coronary artery disease and heart failure, NYHA class II  Chronic coronary artery disease with recent heart failure exacerbation, NYHA class II. Symptoms of fluid overload present. Weight loss indicates partial response to Lasix.  - Reassess BNP levels.  - Monitor weight daily.  - Check potassium levels.  - Schedule transthoracic echocardiogram.  - Continue Lasix, adjust dose based on lab results.  - Consider potassium supplementation if hypokalemia detected.    Cardiac arrhythmia  Suspected arrhythmia, contributing to fatigue.  - Perform EKG to confirm type of arrhythmia.  - Review EKG results before discharge.  - EKG revealed NSR without abnormality.  - Holter monitor ordered.    Essential hypertension  Hypertension uncontrolled. Losartan chosen over Lisinopril due to heart failure.  - Initiate losartan 50 mg daily.  -  Monitor blood pressure with home cuff.  - Reassess blood pressure control in one month.    Depression and generalized anxiety disorder  Moderate to severe depression and moderate anxiety. Lexapro chosen as treatment.  - Initiate Lexapro 5 mg daily.  - Educate on potential side effects of Lexapro.  - Reassess effectiveness of Lexapro in one month.    Constipation  Chronic constipation likely worsened by Lasix and reduced fluid intake.  - Increase fluid intake.  - Consider stool softeners as needed.        Counseling with regards to preventative care given. Medication summary was discussed with the patient to verify compliance and understanding. Medication safety was discussed. A good faith effort was made to reconcile the patient's medications. The patient was given the opportunity to ask questions and those questions were answered to the patient's satisfaction. The patient was encouraged to call with any additional questions or concerns. More than 50% of the visit was spent counseling and coordinating care.    Orders Placed This Encounter    B-TYPE NATRIURETIC PEPTIDE (BNP),PLASMA    COMPREHENSIVE METABOLIC PANEL, NON-FASTING    THYROID  STIMULATING HORMONE WITH FREE T4 REFLEX    EKG (93000) - SAME DAY- Performed in Office    3 DAY EXTENDED HOLTER MONITOR    TRANSTHORACIC ECHOCARDIOGRAM -  ADULT    escitalopram oxalate (LEXAPRO) 5 mg Oral Tablet    losartan (COZAAR) 50 mg Oral Tablet     Return in about 4 weeks (around 04/07/2024).  [facility], APRN, CNP 03/10/2024, 10:07  This note was created with assistance from [employer] via capture of conversational audio. Consent was obtained from the patient and all parties present prior to recording.         [1] No Known Allergies

## 2024-03-12 ENCOUNTER — Encounter (INDEPENDENT_AMBULATORY_CARE_PROVIDER_SITE_OTHER): Payer: Self-pay

## 2024-03-12 ENCOUNTER — Ambulatory Visit (INDEPENDENT_AMBULATORY_CARE_PROVIDER_SITE_OTHER): Payer: Self-pay

## 2024-03-12 ENCOUNTER — Ambulatory Visit (HOSPITAL_COMMUNITY): Admission: RE | Admit: 2024-03-12 | Discharge: 2024-03-12 | Disposition: A | Payer: Self-pay | Source: Ambulatory Visit

## 2024-03-12 ENCOUNTER — Ambulatory Visit: Admission: RE | Admit: 2024-03-12 | Discharge: 2024-03-12 | Disposition: A | Payer: Self-pay | Source: Ambulatory Visit

## 2024-03-12 ENCOUNTER — Other Ambulatory Visit: Payer: Self-pay

## 2024-03-12 DIAGNOSIS — I509 Heart failure, unspecified: Secondary | ICD-10-CM

## 2024-03-12 DIAGNOSIS — I499 Cardiac arrhythmia, unspecified: Secondary | ICD-10-CM

## 2024-03-12 DIAGNOSIS — R002 Palpitations: Secondary | ICD-10-CM

## 2024-03-12 LAB — TRANSTHORACIC ECHOCARDIOGRAM - ADULT
EF VISUAL ESTIMATE: 50
EF: 55

## 2024-03-15 DIAGNOSIS — I5030 Unspecified diastolic (congestive) heart failure: Secondary | ICD-10-CM | POA: Insufficient documentation

## 2024-04-11 LAB — 3 DAY EXTENDED HOLTER MONITOR
Enrollment Period End: 20251104085356
Enrollment Period Start: 20251031092110
Heart rate (average): 75 {beats}/min
Isolated SVE count: 15210 episodes
Isolated VE Counts: 1025 episodes
Longest bigeminy - duration: 5.2 s
Longest supraventricular tachycardia episode - duration: 7.7 s
Longest supraventricular tachycardia episode - heart rate (: 121 {beats}/min
Longest supraventricular tachycardia episode - number of be: 15 beats
SVE Couplets Counts: 2991 episodes
SVE Triplets Counts: 535 episodes
Supraventricular tachycardia - heart rate (average): 116 {beats}/min
Supraventricular tachycardia - number of episodes: 41
Supraventricular tachycardia with fastest heart rate - dura: 2.1 s
Supraventricular tachycardia with fastest heart rate - hear: 142 {beats}/min
Supraventricular tachycardia with fastest heart rate - numb: 5 beats
VE Couplets Counts: 5 episodes
Ventricular tachycardia - heart rate (average): -1 {beats}/min

## 2024-04-12 ENCOUNTER — Other Ambulatory Visit: Payer: Self-pay

## 2024-04-12 ENCOUNTER — Encounter (INDEPENDENT_AMBULATORY_CARE_PROVIDER_SITE_OTHER): Payer: Self-pay

## 2024-04-12 ENCOUNTER — Ambulatory Visit: Payer: Self-pay

## 2024-04-12 VITALS — BP 139/63 | HR 63 | Temp 97.8°F | Resp 18 | Wt 153.4 lb

## 2024-04-12 DIAGNOSIS — I5030 Unspecified diastolic (congestive) heart failure: Secondary | ICD-10-CM

## 2024-04-12 DIAGNOSIS — I10 Essential (primary) hypertension: Secondary | ICD-10-CM

## 2024-04-12 DIAGNOSIS — I2510 Atherosclerotic heart disease of native coronary artery without angina pectoris: Secondary | ICD-10-CM

## 2024-04-12 DIAGNOSIS — I471 Supraventricular tachycardia, unspecified: Secondary | ICD-10-CM

## 2024-04-12 MED ORDER — FUROSEMIDE 20 MG TABLET: 20.0000 mg | ORAL_TABLET | Freq: Every day | ORAL | 0 refills | Status: AC
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
## 2024-04-12 NOTE — Progress Notes (Signed)
 PRN MEDICAL OFFICE [facility]  FAMILY MEDICINE, MEDICAL OFFICE BUILDING  [address]  [place] [address]  [phone]   Ersie  CAMBRE MATSON  July 26, 1933  [id number]    Date of Service: 04/12/2024  Chief complaint:   Chief Complaint   Patient presents with    Follow Up 4 Weeks     Subjective:   History of Present Illness    Judith  C Walton is an 89 year old female with heart failure and hypertension who presents for a follow-up on fluid overload.    She returns for a follow-up on fluid overload, initially noted one month ago. There has been partial weight loss, indicating a response to Lasix . An echocardiogram showed moderate mitral and tricuspid regurgitation with an ejection fraction of 50-55%. The Holter monitor revealed sinus rhythm with a minimum heart rate of 55 and a maximum of 182, along with 41 episodes of supraventricular tachycardia and occasional premature ventricular contractions.    Her blood pressure readings at home have shown systolic values ranging from 104 to 138 and diastolic values from 53 to 69. She was switched from lisinopril to losartan  50 mg daily due to heart failure.    She was started on Lexapro  5 mg for depression, which has improved her mood and sleep. She states, 'I sleep now every night,' and feels her brain is working better.    She ran out of Lasix  about a week ago and has noticed her weight increasing again. She is currently taking losartan , Lexapro , and propranolol .    No new symptoms or concerns.        Past Medical History:   Diagnosis Date    Coronary artery disease involving native coronary artery of native heart     S/P MI and stent placement    DDD (degenerative disc disease), lumbar     Foraminal stenosis of lumbar region     Migraine     Seborrheic keratoses      Past Surgical History:   Procedure Laterality Date    CARDIAC CATHETERIZATION  2007    Stent placement    HX HYSTERECTOMY  1975    HX TONSILLECTOMY      HX WISDOM TEETH EXTRACTION      MOUTH  SURGERY      Dr Reid Boone oral surgery-sept 2022,cyst removal     Current Outpatient Medications   Medication Sig Dispense Refill    cholecalciferol, vitamin D3, 250 mcg (10,000 unit) Oral Capsule Take 1 Capsule (10,000 Units total) by mouth Daily      escitalopram  oxalate (LEXAPRO ) 5 mg Oral Tablet Take 1 Tablet (5 mg total) by mouth Daily 90 Tablet 1    furosemide  (LASIX ) 20 mg Oral Tablet Take 1 Tablet (20 mg total) by mouth Daily 90 Tablet 0    Ibuprofen  (MOTRIN ) 600 mg Oral Tablet TAKE 1 TABLET (600 MG TOTAL) BY MOUTH 4 TIMES A DAY AS NEEDED FOR PAIN 200 Tablet 0    losartan  (COZAAR ) 50 mg Oral Tablet Take 1 Tablet (50 mg total) by mouth Daily 90 Tablet 1    magnesium oxide (MAG-OX) 400 mg Oral Tablet Take 1 Tablet (400 mg total) by mouth Twice daily      propranoloL  (INDERAL  LA) 60 mg Oral Capsule,Sustained Action 24 hr TAKE 1 CAPSULE BY MOUTH EVERY DAY 90 Capsule 1    vitamin B complex Oral Tablet Take 1 Tablet by mouth Daily      zinc Sulfate (ZINCATE) 50 mg zinc (220 mg)  Oral Tablet Take 1 Tablet (50 mg total) by mouth Daily       No current facility-administered medications for this visit.     Allergies[1]    Review of Systems:  Any pertinent Review of Systems as addressed in the HPI above.    Objective:   BP 139/63   Pulse 63   Temp 36.6 C (97.8 F) (Temporal)   Resp 18   Wt 69.6 kg (153 lb 6.4 oz)   SpO2 97%   BMI 28.98 kg/m     BMI addressed: Advised on diet, weight loss, and exercise to reduce above normal BMI.     Physical Exam    CONSTITUTIONAL: Patient is not in acute distress. Normal appearance.  HENT: Mucous membranes are moist. Pupils are equal, round, and reactive to light.  CARDIOVASCULAR: Normal rate and regular rhythm.  Normal pulses.  Normal heart sounds. Murmur present.  PULMONARY: Pulmonary effort is normal. Normal breath sounds.  MUSCULOSKELETAL: No swelling or deformity. No tenderness.  SKIN: Skin is warm and dry.  NEUROLOGICAL: No focal deficit present. Alert and oriented  to person, place, and time.  PSYCHIATRIC: Mood and affect normal.        Laboratory:  COMPLETE BLOOD COUNT   Lab Results   Component Value Date    WBC 10.2 02/13/2024    HGB 13.0 02/13/2024    HCT 37.6 02/13/2024    PLTCNT 254 02/13/2024     DIFFERENTIAL  Lab Results   Component Value Date    PMNS 64 02/13/2024    LYMPHOCYTES 24 02/13/2024    MONOCYTES 9 02/13/2024    EOSINOPHIL 3 02/13/2024    BASOPHILS 1 02/13/2024    BASOPHILS 0.10 02/13/2024    PMNABS 6.50 02/13/2024    LYMPHSABS 2.50 02/13/2024    EOSABS 0.30 02/13/2024    MONOSABS 0.90 02/13/2024     BASIC METABOLIC PANEL  Lab Results   Component Value Date    SODIUM 141 03/10/2024    POTASSIUM 4.4 03/10/2024    CHLORIDE 104 03/10/2024    CO2 30 03/10/2024    ANIONGAP 7 03/10/2024    BUN 18 03/10/2024    CREATININE 0.76 03/10/2024    BUNCRRATIO 24 (H) 03/10/2024    GFR 75 03/10/2024    CALCIUM 9.4 03/10/2024    GLUCOSENF 100 03/10/2024      LIPID PROFILE  Lab Results   Component Value Date    CHOLESTEROL 222 (H) 10/13/2023    HDLCHOL 59 10/13/2023    LDLCHOL 141 (H) 10/13/2023    TRIG 110 10/13/2023     LIVER TESTS  Lab Results   Component Value Date    ALBUMIN 4.1 03/10/2024    AST 11 (L) 03/10/2024    ALT 13 03/10/2024    ALKPHOS 53 03/10/2024    TOTBILIRUBIN 0.5 03/10/2024    BILIRUBINCON 0.04 10/13/2023     THYROID  STIMULATING HORMONE  Lab Results   Component Value Date    TSH 1.104 03/10/2024      Results    EKG: Normal sinus rhythm without abnormality (03/13/2024)  Echocardiogram: Moderate mitral and tricuspid regurgitation, ejection fraction 50-55%  Holter monitor: Sinus rhythm, minimum heart rate 55, maximum heart rate 182, 41 episodes of supraventricular tachycardia (SVT), occasional premature ventricular contractions (PVCs)        Most recent results reviewed with patient.      ICD-10-CM    1. Heart failure with preserved ejection fraction, borderline, class II (CMS HCC)  I50.30 Referral  to CARDIOLOGY - [place] - WARD, KANJERLA      2. SVT  (supraventricular tachycardia) (CMS HCC)  I47.10 Referral to CARDIOLOGY - [place] - WARD, KANJERLA      3. Coronary artery disease involving native coronary artery of native heart  I25.10 Referral to CARDIOLOGY - [place] - WARD, KANJERLA      4. Hypertension, unspecified type  I10 Referral to CARDIOLOGY - [place] - WARD, [facility]         Assessment & Plan     Heart failure with preserved ejection fraction and fluid overload  Partial response to Lasix  with weight loss. Ejection fraction preserved at 50-55%.  - Continue Lasix  20 mg daily until cardiology evaluation.    Supraventricular tachycardia  Holter monitor showed 41 SVT episodes, heart rate 55-182 bpm, occasional PVCs. Discussed SVT risks and Valsalva maneuver.  - Referred to cardiology for further evaluation and management.  - Continue propranolol .    Atherosclerotic heart disease of native coronary artery (coronary artery disease)  Coronary artery disease with previous stent in 2007.  - Referred to cardiology for comprehensive evaluation.    Essential hypertension  Blood pressure controlled with losartan . Home readings: systolic 103-138 mmHg, diastolic 53-69 mmHg.  - Continue losartan  50 mg daily.    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 No immediate intervention needed.  - Referred to cardiology for further evaluation.    Depression  Managed with Lexapro  5 mg daily. Reports symptom improvement and better sleep.  - Continue Lexapro  5 mg daily.        Counseling with regards to preventative care given. Medication summary was discussed with the patient to verify compliance and understanding. Medication safety was discussed. A good faith effort was made to reconcile the patient's medications. The patient was given the opportunity to ask questions and those questions were answered to the patient's satisfaction. The patient was encouraged to call  with any additional questions or concerns. More than 50% of the visit was spent counseling and coordinating care.    Orders Placed This Encounter    Referral to CARDIOLOGY - [place] - WARD, KANJERLA    furosemide  (LASIX ) 20 mg Oral Tablet     Return in about 3 months (around 07/11/2024).  [facility], APRN, CNP 04/12/2024, 10:27  This note was created with assistance from [employer] via capture of conversational audio. Consent was obtained from the patient and all parties present prior to recording.       [1] No Known Allergies

## 2024-04-13 ENCOUNTER — Encounter (INDEPENDENT_AMBULATORY_CARE_PROVIDER_SITE_OTHER): Payer: Self-pay | Admitting: NURSE PRACTITIONER

## 2024-04-13 ENCOUNTER — Ambulatory Visit: Payer: Self-pay | Admitting: NURSE PRACTITIONER

## 2024-04-13 VITALS — Ht 62.0 in | Wt 150.0 lb

## 2024-04-13 DIAGNOSIS — H6123 Impacted cerumen, bilateral: Secondary | ICD-10-CM

## 2024-04-13 NOTE — Procedures (Signed)
 [facility], [facility]  [address]  [place] [address]  Operated by [facility]  Procedure Note    Name: Judith Walton MRN:  [id number]   Date: 04/13/2024 DOB:  Apr 13, 1934 (89 y.o.)         69210 - REMOVAL IMPACTED CERUMEN W/ INSTRUMENT, UNILATERAL (AMB ONLY-PD)    Performed by: Ira Setter, APRN, CNP  Authorized by: Ira Setter, APRN, CNP    Time Out:     Immediately before the procedure, a time out was called:  Yes    Patient verified:  Yes    Procedure Verified:  Yes    Site Verified:  Yes  Documentation:      Procedure: Cerumen cleaning   Pre-op Dx: Cerumen impaction   Bilateral EACs examined under binocular microscopy. Cerumen and/or debris was cleaned from the canals using curettes, suction, and alligator forceps. Patient tolerated procedure well.            Setter Ira, APRN, CNP

## 2024-04-13 NOTE — Progress Notes (Signed)
 [facility]  [address]  [place] [address]  Phone: [phone]  Fax: [phone]      Encounter Date: 04/13/2024    Patient ID: Judith Walton  MRN: [id number]    DOB: [birth year]  Age: 89 y.o. female     Progress Note       Referring Provider:  Silva Bart, APRN, CNP    Reason for Visit:   Chief Complaint   Patient presents with    Wax in Ear     1 year rc on ears. Pt here for routine cerumen removal. Pt complains of fullness        History of Present Illness:  Judith  C Walton is a 89 y.o. female cerumen check      Patient History:  Patient Active Problem List   Diagnosis    DDD (degenerative disc disease), lumbar    Migraine    Coronary artery disease involving native coronary artery of native heart    Seborrheic keratoses    Essential tremor    Left wrist fracture, with malunion, subsequent encounter    Refusal of statin medication by patient    Hypertension    Moderate major depression (CMS HCC)    Moderate anxiety    Heart failure with preserved ejection fraction, borderline, class II (CMS HCC)     Current Outpatient Medications   Medication Sig    cholecalciferol, vitamin D3, 250 mcg (10,000 unit) Oral Capsule Take 1 Capsule (10,000 Units total) by mouth Daily    escitalopram  oxalate (LEXAPRO ) 5 mg Oral Tablet Take 1 Tablet (5 mg total) by mouth Daily    furosemide  (LASIX ) 20 mg Oral Tablet Take 1 Tablet (20 mg total) by mouth Daily    Ibuprofen  (MOTRIN ) 600 mg Oral Tablet TAKE 1 TABLET (600 MG TOTAL) BY MOUTH 4 TIMES A DAY AS NEEDED FOR PAIN    losartan  (COZAAR ) 50 mg Oral Tablet Take 1 Tablet (50 mg total) by mouth Daily    magnesium oxide (MAG-OX) 400 mg Oral Tablet Take 1 Tablet (400 mg total) by mouth Twice daily    propranoloL  (INDERAL  LA) 60 mg Oral Capsule,Sustained Action 24 hr TAKE 1 CAPSULE BY MOUTH EVERY DAY    vitamin B complex Oral Tablet Take 1 Tablet by mouth Daily    zinc Sulfate (ZINCATE) 50 mg zinc (220 mg) Oral Tablet Take 1 Tablet (50 mg total) by mouth Daily       No Known Allergies  Past Medical History:   Diagnosis Date    Coronary artery disease involving native coronary artery of native heart     S/P MI and stent placement    DDD (degenerative disc disease), lumbar     Foraminal stenosis of lumbar region     Migraine     Seborrheic keratoses      Past Surgical History:   Procedure Laterality Date    CARDIAC CATHETERIZATION  2007    Stent placement    HX HYSTERECTOMY  1975    HX TONSILLECTOMY      HX WISDOM TEETH EXTRACTION      MOUTH SURGERY      Dr Reid Boone oral surgery-sept 2022,cyst removal     Family Medical History:       Problem Relation (Age of Onset)    Cancer Other    Heart Disease Other    Hypertension (High Blood Pressure) Other    Migraines Other    No Known Problems Father, Brother, Brother, Brother  Osteoporosis Mother            Social History     Tobacco Use    Smoking status: Never    Smokeless tobacco: Never   Vaping Use    Vaping status: Never Used   Substance Use Topics    Alcohol use: Never    Drug use: Never       Review of Systems     Vitals:    04/13/24 1454   Weight: 68 kg (150 lb)   Height: 1.575 m (5' 2)   BMI: 27.44      ENT Physical Exam  Constitutional  Appearance: patient appears well-developed, well-nourished and well-groomed,  Communication/Voice: communication appropriate for developmental age; vocal quality normal;  Head and Face  Appearance: head appears normal, face appears normal and face appears atraumatic;  Ear  Hearing: intact;  Auricles: right auricle normal; left auricle normal;  External Mastoids: right external mastoid normal; left external mastoid normal;  Ear Canals: bilateral ear canals impacted cerumen observed;  Tympanic Membranes: right tympanic membrane normal; left tympanic membrane normal;  Neurovestibular  Mental Status: alert and oriented;  Psychiatric: mood normal; affect is appropriate;       Assessment:  No diagnosis found.      Plan:  Medical records reviewed on 04/13/2024.  Cerumen removed  AU    No orders of the defined types were placed in this encounter.    No follow-ups on file.    Eleanor Blazer, FNP-BC  04/14/2023, 13:46

## 2024-05-20 ENCOUNTER — Encounter (INDEPENDENT_AMBULATORY_CARE_PROVIDER_SITE_OTHER): Payer: Self-pay | Admitting: PHYSICIAN ASSISTANT

## 2024-05-20 ENCOUNTER — Ambulatory Visit: Attending: PHYSICIAN ASSISTANT | Admitting: NURSE PRACTITIONER

## 2024-05-20 ENCOUNTER — Other Ambulatory Visit: Payer: Self-pay

## 2024-05-20 VITALS — BP 175/90 | HR 75 | Ht 62.0 in | Wt 147.0 lb

## 2024-05-20 DIAGNOSIS — I2510 Atherosclerotic heart disease of native coronary artery without angina pectoris: Secondary | ICD-10-CM

## 2024-05-20 DIAGNOSIS — I10 Essential (primary) hypertension: Secondary | ICD-10-CM

## 2024-05-20 DIAGNOSIS — I471 Supraventricular tachycardia, unspecified: Secondary | ICD-10-CM

## 2024-05-20 DIAGNOSIS — I2089 Other forms of angina pectoris: Secondary | ICD-10-CM

## 2024-05-20 DIAGNOSIS — E785 Hyperlipidemia, unspecified: Secondary | ICD-10-CM

## 2024-05-20 DIAGNOSIS — I5030 Unspecified diastolic (congestive) heart failure: Secondary | ICD-10-CM

## 2024-05-20 MED ORDER — PROPRANOLOL ER 60 MG CAPSULE,24 HR,EXTENDED RELEASE: 60.0000 mg | ORAL_CAPSULE | Freq: Every day | ORAL | 1 refills | Status: DC

## 2024-05-20 MED ORDER — ATORVASTATIN 40 MG TABLET: 40.0000 mg | ORAL_TABLET | Freq: Every day | ORAL | 3 refills | Status: AC

## 2024-05-20 MED ORDER — PROPRANOLOL ER 80 MG CAPSULE,24 HR,EXTENDED RELEASE: 80.0000 mg | ORAL_CAPSULE | Freq: Every day | ORAL | 1 refills | Status: AC

## 2024-05-20 NOTE — Progress Notes (Signed)
 [facility], [place]  C, 89 y.o. female  Date of Service: 05/20/2024  Date of Birth:  01-11-1934  PCP:  [facility], APRN, CNP  Chief Complaint   Patient presents with    Establish Care     Heart Failure  Holter Monitor 10.31.25 per PCP  Echo 10.31.25 per PCP    Heart Disease    Hypertension        HPI:    Patient has past medical history of coronary artery disease with PCI to the LAD in 2007, HFpEF, SVT, moderate mitral and tricuspid regurgitation, hypertension, and hyperlipidemia.  02/2024 Holter monitor showed a rate of 55 to a max of 182 beats per minute with an average heart rate of 75 beats per minute predominant underlying rhythm was sinus.  The report states she had 41 SVT runs with the fastest interval lasting 5 beats with a max rate of 182 beats per minute.  The longest run lasted 15 beats.  The patient did not trigger any symptoms.  02/2024 echocardiogram LVEF estimated 50-55%, grade 1 diastolic dysfunction mildly dilated LA, moderate mitral and moderate tricuspid regurgitation.    05/20/2024 the patient presents to establish care for abnormal Holter monitor showing frequent episodes of SVT.  She has been on propranolol  and tolerating this well.  She does reports complaints dyspnea on exertion and excessive fatigue that has worsened over the past 1 year.  She has had near syncopal episodes with her heart palpitations.  Unfortunately she tells me she has not been on statin her PCI.  There has been no stress testing or additional cardiac catheterization stents.  The blood pressure is elevated today she says typically runs in the 130s it was 139/63 at her PCP office a few months back.    EKG:  Sinus rhythm 75 beats per minute nonspecific ST abnormality  LAB:   02/2024 BNP 152, hemoglobin 13.0, TSH 1.104 platelets 254  Lab Results   Component Value Date    CHOLESTEROL 222 (H) 10/13/2023    HDLCHOL 59 10/13/2023    LDLCHOL 141 (H) 10/13/2023    TRIG 110 10/13/2023        No results found for: HA1C   Last BMP  (Last result in the past 2 years)        Na   K   Cl   CO2   BUN   Cr   Calcium   Glucose   Glucose-Fasting        03/10/24 1007 141   4.4   104   30   18   0.76   9.4   100                  Past Medical History:   Diagnosis Date    Coronary artery disease involving native coronary artery of native heart     S/P MI and stent placement    DDD (degenerative disc disease), lumbar     Foraminal stenosis of lumbar region     Migraine     Seborrheic keratoses        Past Surgical History:   Procedure Laterality Date    CARDIAC CATHETERIZATION  2007    Stent placement    HX HYSTERECTOMY  1975    HX TONSILLECTOMY      HX WISDOM TEETH EXTRACTION      MOUTH SURGERY      Dr Reid Boone oral surgery-sept 2022,cyst removal  Current Outpatient Medications   Medication Sig    aspirin (ECOTRIN) 81 mg Oral Tablet, Delayed Release (E.C.) Take 1 Tablet (81 mg total) by mouth Daily    atorvastatin  (LIPITOR) 40 mg Oral Tablet Take 1 Tablet (40 mg total) by mouth Daily    calcium carbonate/vitamin D3 (CALCIUM 600 WITH VITAMIN D3 ORAL) Take 1 Tablet by mouth Daily    cholecalciferol, vitamin D3, 250 mcg (10,000 unit) Oral Capsule Take 1 Capsule (10,000 Units total) by mouth Daily    escitalopram  oxalate (LEXAPRO ) 5 mg Oral Tablet Take 1 Tablet (5 mg total) by mouth Daily    furosemide  (LASIX ) 20 mg Oral Tablet Take 1 Tablet (20 mg total) by mouth Daily    Ibuprofen  (MOTRIN ) 600 mg Oral Tablet TAKE 1 TABLET (600 MG TOTAL) BY MOUTH 4 TIMES A DAY AS NEEDED FOR PAIN    losartan  (COZAAR ) 50 mg Oral Tablet Take 1 Tablet (50 mg total) by mouth Daily    magnesium oxide (MAG-OX) 400 mg Oral Tablet Take 1 Tablet (400 mg total) by mouth Twice daily    mv-min/FA/vit K/lutein/zeaxant (PRESERVISION AREDS 2 PLUS MV ORAL) Take 2 Capsules by mouth Daily    oxyBUTYnin  chloride (DITROPAN  XL) 10 mg Oral Tablet Extended Rel 24 hr Take 1 Tablet (10 mg total) by mouth Daily    propranoloL  (INDERAL  LA) 60 mg  Oral Capsule,Sustained Action 24 hr TAKE 1 CAPSULE BY MOUTH EVERY DAY    vitamin B complex Oral Tablet Take 1 Tablet by mouth Daily    zinc Sulfate (ZINCATE) 50 mg zinc (220 mg) Oral Tablet Take 1 Tablet (50 mg total) by mouth Daily     ROS: Other than issues noted in HPI, all other systems were negative.     Exam:  Vitals:    05/20/24 1325   BP: (!) 175/90   Pulse: 75   SpO2: 99%   Weight: 66.7 kg (147 lb)   Height: 1.575 m (5' 2)   BMI: 26.89       General: No acute distress and appears stated age.    HEENT:Head normocephalic, atraumatic. ENT without erythema or injection, mucouse membranes moist.    Neck: No JVD, no carotid bruit. and supple, symmetrical, trachea midline.   Lungs: Clear to auscultation bilaterally.    Cardiovascular: Regular rate and rhythm, normal S1 S2, no murmur, no rub, or gallop, no thrill     Abdomen: Soft, non-tender and bowel sounds normal.    Extremities: Extremities normal, atraumatic, no cyanosis or edema.    Skin: Skin warm and dry.    Neurologic: Alert and oriented x3.  Psych: Mood and affect congruent for age and gender     Orders placed this visit:  Orders Placed This Encounter    ECG 12 Lead w/ Interp ([facility]  - In Clinic, Same Day)    MYOCARDIAL PERFUSION COMPLETE    atorvastatin  (LIPITOR) 40 mg Oral Tablet       Assessment/Plan:  Atypical angina (CMS HCC)    Coronary artery disease involving native coronary artery of native heart    Heart failure with preserved ejection fraction, borderline, class II (CMS HCC)    SVT (supraventricular tachycardia) (CMS HCC)    Essential hypertension    Hyperlipidemia with target LDL less than 70    Continue aspirin 81 mg daily  Continue Lasix  20 mg daily  Continue losartan  50 mg daily  Increase propranolol  80 mg daily  Add atorvastatin  40 mg daily  LDL target less than 70  Monitor blood pressure and heart rate keep a log at home bring to next visit   Blood pressure goal 110-130/60-80  MPS  Return to clinic in 3 months to follow up for  testing      Charlot Gouin, APRN, [facility] 05/20/2024 14:50

## 2024-05-21 DIAGNOSIS — R9431 Abnormal electrocardiogram [ECG] [EKG]: Secondary | ICD-10-CM

## 2024-05-21 LAB — ECG 12 LEAD W/ INTERP (AMB USE ONLY) (MUSE, IN CLINC) (93005/93010)
Atrial Rate: 75 {beats}/min
Calculated P Axis: 54 degrees
Calculated R Axis: 65 degrees
Calculated T Axis: 75 degrees
PR Interval: 138 ms
QT Interval: 396 ms
QTC Calculation: 442 ms
Ventricular rate: 75 {beats}/min

## 2024-07-01 ENCOUNTER — Ambulatory Visit (HOSPITAL_COMMUNITY)

## 2024-07-01 ENCOUNTER — Ambulatory Visit

## 2024-07-01 ENCOUNTER — Ambulatory Visit (HOSPITAL_COMMUNITY): Payer: Self-pay

## 2024-07-13 ENCOUNTER — Ambulatory Visit (INDEPENDENT_AMBULATORY_CARE_PROVIDER_SITE_OTHER): Payer: Self-pay

## 2024-08-18 ENCOUNTER — Ambulatory Visit (INDEPENDENT_AMBULATORY_CARE_PROVIDER_SITE_OTHER): Payer: Self-pay | Admitting: PHYSICIAN ASSISTANT

## 2024-10-18 ENCOUNTER — Ambulatory Visit (INDEPENDENT_AMBULATORY_CARE_PROVIDER_SITE_OTHER): Payer: Self-pay

## 2025-04-13 ENCOUNTER — Ambulatory Visit (INDEPENDENT_AMBULATORY_CARE_PROVIDER_SITE_OTHER): Payer: Self-pay | Admitting: NURSE PRACTITIONER
